# Patient Record
Sex: Female | Born: 1985 | Race: White | Hispanic: No | Marital: Married | Smoking: Never smoker
Health system: Southern US, Community
[De-identification: ages and names within clinical notes are randomized; demographics above are authoritative.]

## PROBLEM LIST (undated history)

## (undated) DIAGNOSIS — E119 Type 2 diabetes mellitus without complications: Secondary | ICD-10-CM

## (undated) DIAGNOSIS — O24419 Gestational diabetes mellitus in pregnancy, unspecified control: Secondary | ICD-10-CM

## (undated) HISTORY — PX: NO PAST SURGERIES: SHX2092

## (undated) HISTORY — PX: DILATION AND CURETTAGE OF UTERUS: SHX78

---
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
## 2004-02-29 ENCOUNTER — Other Ambulatory Visit: Admission: RE | Admit: 2004-02-29 | Discharge: 2004-02-29 | Payer: Self-pay | Admitting: Obstetrics and Gynecology

## 2004-07-28 ENCOUNTER — Other Ambulatory Visit: Admission: RE | Admit: 2004-07-28 | Discharge: 2004-07-28 | Payer: Self-pay | Admitting: Obstetrics and Gynecology

## 2005-12-25 ENCOUNTER — Other Ambulatory Visit: Admission: RE | Admit: 2005-12-25 | Discharge: 2005-12-25 | Payer: Self-pay | Admitting: Obstetrics & Gynecology

## 2006-03-01 ENCOUNTER — Other Ambulatory Visit: Admission: RE | Admit: 2006-03-01 | Discharge: 2006-03-01 | Payer: Self-pay | Admitting: Obstetrics & Gynecology

## 2015-08-28 ENCOUNTER — Encounter: Payer: Self-pay | Admitting: Nurse Practitioner

## 2016-02-20 ENCOUNTER — Other Ambulatory Visit (HOSPITAL_COMMUNITY): Payer: Self-pay | Admitting: Obstetrics and Gynecology

## 2016-02-20 DIAGNOSIS — Z3141 Encounter for fertility testing: Secondary | ICD-10-CM

## 2016-02-25 ENCOUNTER — Encounter (HOSPITAL_COMMUNITY): Payer: Self-pay | Admitting: Radiology

## 2016-02-25 ENCOUNTER — Ambulatory Visit (HOSPITAL_COMMUNITY)
Admission: RE | Admit: 2016-02-25 | Discharge: 2016-02-25 | Disposition: A | Discharge status: Home / Self Care | Payer: 59 | Source: Ambulatory Visit | Attending: Obstetrics and Gynecology | Admitting: Obstetrics and Gynecology

## 2016-02-25 DIAGNOSIS — Z3141 Encounter for fertility testing: Secondary | ICD-10-CM | POA: Insufficient documentation

## 2016-02-25 MED ORDER — IOPAMIDOL (ISOVUE-300) INJECTION 61%
30.0000 mL | Freq: Once | INTRAVENOUS | Status: AC | PRN
  Administered 2016-02-25: 7 mL

## 2017-02-10 DIAGNOSIS — Z3401 Encounter for supervision of normal first pregnancy, first trimester: Secondary | ICD-10-CM | POA: Diagnosis not present

## 2017-02-10 DIAGNOSIS — Z3685 Encounter for antenatal screening for Streptococcus B: Secondary | ICD-10-CM | POA: Diagnosis not present

## 2017-02-10 LAB — OB RESULTS CONSOLE ABO/RH: RH Type: POSITIVE

## 2017-02-10 LAB — OB RESULTS CONSOLE GC/CHLAMYDIA
CHLAMYDIA, DNA PROBE: NEGATIVE
Gonorrhea: NEGATIVE

## 2017-02-10 LAB — OB RESULTS CONSOLE RUBELLA ANTIBODY, IGM: Rubella: IMMUNE

## 2017-02-10 LAB — OB RESULTS CONSOLE HEPATITIS B SURFACE ANTIGEN: HEP B S AG: NEGATIVE

## 2017-02-10 LAB — OB RESULTS CONSOLE HIV ANTIBODY (ROUTINE TESTING): HIV: NONREACTIVE

## 2017-02-10 LAB — OB RESULTS CONSOLE ANTIBODY SCREEN: Antibody Screen: NEGATIVE

## 2017-02-10 LAB — OB RESULTS CONSOLE RPR: RPR: NONREACTIVE

## 2017-02-12 DIAGNOSIS — Z3400 Encounter for supervision of normal first pregnancy, unspecified trimester: Secondary | ICD-10-CM | POA: Diagnosis not present

## 2017-02-12 DIAGNOSIS — Z3A11 11 weeks gestation of pregnancy: Secondary | ICD-10-CM | POA: Diagnosis not present

## 2017-02-12 DIAGNOSIS — Z3480 Encounter for supervision of other normal pregnancy, unspecified trimester: Secondary | ICD-10-CM | POA: Diagnosis not present

## 2017-02-17 DIAGNOSIS — Z3A12 12 weeks gestation of pregnancy: Secondary | ICD-10-CM | POA: Diagnosis not present

## 2017-02-17 DIAGNOSIS — Z3491 Encounter for supervision of normal pregnancy, unspecified, first trimester: Secondary | ICD-10-CM | POA: Diagnosis not present

## 2017-04-01 DIAGNOSIS — Z3A18 18 weeks gestation of pregnancy: Secondary | ICD-10-CM | POA: Diagnosis not present

## 2017-05-31 DIAGNOSIS — Z3480 Encounter for supervision of other normal pregnancy, unspecified trimester: Secondary | ICD-10-CM | POA: Diagnosis not present

## 2017-05-31 DIAGNOSIS — Z23 Encounter for immunization: Secondary | ICD-10-CM | POA: Diagnosis not present

## 2017-08-03 DIAGNOSIS — Z3685 Encounter for antenatal screening for Streptococcus B: Secondary | ICD-10-CM | POA: Diagnosis not present

## 2017-08-20 ENCOUNTER — Telehealth (HOSPITAL_COMMUNITY): Payer: Self-pay | Admitting: *Deleted

## 2017-08-20 ENCOUNTER — Encounter (HOSPITAL_COMMUNITY): Payer: Self-pay | Admitting: *Deleted

## 2017-08-20 NOTE — Telephone Encounter (Signed)
Preadmission screen  

## 2017-08-29 ENCOUNTER — Inpatient Hospital Stay (HOSPITAL_COMMUNITY): Admission: AD | Admit: 2017-08-29 | Payer: 59 | Source: Ambulatory Visit | Admitting: Obstetrics and Gynecology

## 2017-08-30 MED ORDER — MISOPROSTOL 25 MCG QUARTER TABLET
25.0000 ug | ORAL_TABLET | ORAL | Status: DC
  Administered 2017-08-31 (×2): 25 ug via VAGINAL
  Filled 2017-08-30 (×7): qty 1

## 2017-08-31 ENCOUNTER — Inpatient Hospital Stay (HOSPITAL_COMMUNITY)
Admission: RE | Admit: 2017-08-31 | Discharge: 2017-09-02 | DRG: 807 | Disposition: A | Discharge status: Home / Self Care | Payer: 59 | Attending: Obstetrics and Gynecology | Admitting: Obstetrics and Gynecology

## 2017-08-31 ENCOUNTER — Other Ambulatory Visit: Payer: Self-pay

## 2017-08-31 ENCOUNTER — Encounter (HOSPITAL_COMMUNITY): Payer: Self-pay

## 2017-08-31 ENCOUNTER — Inpatient Hospital Stay (HOSPITAL_COMMUNITY): Payer: 59 | Admitting: Anesthesiology

## 2017-08-31 DIAGNOSIS — O9902 Anemia complicating childbirth: Principal | ICD-10-CM | POA: Diagnosis present

## 2017-08-31 DIAGNOSIS — D649 Anemia, unspecified: Secondary | ICD-10-CM | POA: Diagnosis not present

## 2017-08-31 DIAGNOSIS — O26893 Other specified pregnancy related conditions, third trimester: Secondary | ICD-10-CM | POA: Diagnosis not present

## 2017-08-31 DIAGNOSIS — Z3A40 40 weeks gestation of pregnancy: Secondary | ICD-10-CM

## 2017-08-31 LAB — CBC
HCT: 34.3 % — ABNORMAL LOW (ref 36.0–46.0)
Hemoglobin: 11.5 g/dL — ABNORMAL LOW (ref 12.0–15.0)
MCH: 26.8 pg (ref 26.0–34.0)
MCHC: 33.5 g/dL (ref 30.0–36.0)
MCV: 80 fL (ref 78.0–100.0)
Platelets: 199 10*3/uL (ref 150–400)
RBC: 4.29 MIL/uL (ref 3.87–5.11)
RDW: 14.2 % (ref 11.5–15.5)
WBC: 9.4 10*3/uL (ref 4.0–10.5)

## 2017-08-31 LAB — ABO/RH: ABO/RH(D): A POS

## 2017-08-31 LAB — OB RESULTS CONSOLE GBS: GBS: NEGATIVE

## 2017-08-31 LAB — TYPE AND SCREEN
ABO/RH(D): A POS
ANTIBODY SCREEN: NEGATIVE

## 2017-08-31 LAB — RPR: RPR Ser Ql: NONREACTIVE

## 2017-08-31 MED ORDER — EPHEDRINE 5 MG/ML INJ
10.0000 mg | INTRAVENOUS | Status: DC | PRN
  Filled 2017-08-31: qty 2

## 2017-08-31 MED ORDER — PHENYLEPHRINE 40 MCG/ML (10ML) SYRINGE FOR IV PUSH (FOR BLOOD PRESSURE SUPPORT)
80.0000 ug | PREFILLED_SYRINGE | INTRAVENOUS | Status: DC | PRN
  Filled 2017-08-31: qty 5
  Filled 2017-08-31: qty 10

## 2017-08-31 MED ORDER — OXYTOCIN 40 UNITS IN LACTATED RINGERS INFUSION - SIMPLE MED
1.0000 m[IU]/min | INTRAVENOUS | Status: DC
  Administered 2017-08-31: 2 m[IU]/min via INTRAVENOUS

## 2017-08-31 MED ORDER — TERBUTALINE SULFATE 1 MG/ML IJ SOLN
0.2500 mg | Freq: Once | INTRAMUSCULAR | Status: DC | PRN
  Filled 2017-08-31: qty 1

## 2017-08-31 MED ORDER — PRENATAL MULTIVITAMIN CH
1.0000 | ORAL_TABLET | Freq: Every day | ORAL | Status: DC
  Filled 2017-08-31: qty 1

## 2017-08-31 MED ORDER — OXYCODONE-ACETAMINOPHEN 5-325 MG PO TABS: 1.0000 | ORAL_TABLET | ORAL | Status: DC | PRN

## 2017-08-31 MED ORDER — MEDROXYPROGESTERONE ACETATE 150 MG/ML IM SUSP: 150.0000 mg | INTRAMUSCULAR | Status: DC | PRN

## 2017-08-31 MED ORDER — ONDANSETRON HCL 4 MG/2ML IJ SOLN: 4.0000 mg | INTRAMUSCULAR | Status: DC | PRN

## 2017-08-31 MED ORDER — FENTANYL 2.5 MCG/ML BUPIVACAINE 1/10 % EPIDURAL INFUSION (WH - ANES)
14.0000 mL/h | INTRAMUSCULAR | Status: DC | PRN
  Administered 2017-08-31 (×2): 14 mL/h via EPIDURAL
  Filled 2017-08-31 (×2): qty 100

## 2017-08-31 MED ORDER — SOD CITRATE-CITRIC ACID 500-334 MG/5ML PO SOLN: 30.0000 mL | ORAL | Status: DC | PRN

## 2017-08-31 MED ORDER — OXYTOCIN BOLUS FROM INFUSION
500.0000 mL | Freq: Once | INTRAVENOUS | Status: AC
  Administered 2017-08-31: 500 mL via INTRAVENOUS

## 2017-08-31 MED ORDER — LIDOCAINE HCL (PF) 1 % IJ SOLN
30.0000 mL | INTRAMUSCULAR | Status: DC | PRN
  Filled 2017-08-31: qty 30

## 2017-08-31 MED ORDER — DIPHENHYDRAMINE HCL 25 MG PO CAPS: 25.0000 mg | ORAL_CAPSULE | Freq: Four times a day (QID) | ORAL | Status: DC | PRN

## 2017-08-31 MED ORDER — EPHEDRINE 5 MG/ML INJ: 10.0000 mg | INTRAVENOUS | Status: DC | PRN

## 2017-08-31 MED ORDER — PHENYLEPHRINE 40 MCG/ML (10ML) SYRINGE FOR IV PUSH (FOR BLOOD PRESSURE SUPPORT)
80.0000 ug | PREFILLED_SYRINGE | INTRAVENOUS | Status: DC | PRN
  Filled 2017-08-31: qty 5

## 2017-08-31 MED ORDER — MEASLES, MUMPS & RUBELLA VAC ~~LOC~~ INJ: 0.5000 mL | INJECTION | Freq: Once | SUBCUTANEOUS | Status: DC

## 2017-08-31 MED ORDER — DIPHENHYDRAMINE HCL 50 MG/ML IJ SOLN: 12.5000 mg | INTRAMUSCULAR | Status: DC | PRN

## 2017-08-31 MED ORDER — ACETAMINOPHEN 325 MG PO TABS: 650.0000 mg | ORAL_TABLET | ORAL | Status: DC | PRN

## 2017-08-31 MED ORDER — ONDANSETRON HCL 4 MG/2ML IJ SOLN: 4.0000 mg | Freq: Four times a day (QID) | INTRAMUSCULAR | Status: DC | PRN

## 2017-08-31 MED ORDER — IBUPROFEN 600 MG PO TABS
600.0000 mg | ORAL_TABLET | Freq: Four times a day (QID) | ORAL | Status: DC
  Administered 2017-09-01 – 2017-09-02 (×6): 600 mg via ORAL
  Filled 2017-08-31 (×6): qty 1

## 2017-08-31 MED ORDER — LACTATED RINGERS IV SOLN
500.0000 mL | Freq: Once | INTRAVENOUS | Status: AC
  Administered 2017-08-31: 500 mL via INTRAVENOUS

## 2017-08-31 MED ORDER — LACTATED RINGERS IV SOLN
INTRAVENOUS | Status: DC
  Administered 2017-08-31 (×4): via INTRAVENOUS

## 2017-08-31 MED ORDER — OXYCODONE-ACETAMINOPHEN 5-325 MG PO TABS: 2.0000 | ORAL_TABLET | ORAL | Status: DC | PRN

## 2017-08-31 MED ORDER — COCONUT OIL OIL
1.0000 "application " | TOPICAL_OIL | Status: DC | PRN
  Administered 2017-09-02: 1 via TOPICAL
  Filled 2017-08-31: qty 120

## 2017-08-31 MED ORDER — ACETAMINOPHEN 325 MG PO TABS
650.0000 mg | ORAL_TABLET | ORAL | Status: DC | PRN
Start: 2017-08-31 — End: 2017-09-02
  Administered 2017-09-01: 650 mg via ORAL
  Filled 2017-08-31: qty 2

## 2017-08-31 MED ORDER — SENNOSIDES-DOCUSATE SODIUM 8.6-50 MG PO TABS
2.0000 | ORAL_TABLET | ORAL | Status: DC
  Administered 2017-09-01: 2 via ORAL
  Filled 2017-08-31: qty 2

## 2017-08-31 MED ORDER — OXYTOCIN 40 UNITS IN LACTATED RINGERS INFUSION - SIMPLE MED
2.5000 [IU]/h | INTRAVENOUS | Status: DC
  Filled 2017-08-31: qty 1000

## 2017-08-31 MED ORDER — METHYLERGONOVINE MALEATE 0.2 MG/ML IJ SOLN
INTRAMUSCULAR | Status: AC
  Filled 2017-08-31: qty 1

## 2017-08-31 MED ORDER — DIBUCAINE 1 % RE OINT: 1.0000 "application " | TOPICAL_OINTMENT | RECTAL | Status: DC | PRN

## 2017-08-31 MED ORDER — ONDANSETRON HCL 4 MG PO TABS: 4.0000 mg | ORAL_TABLET | ORAL | Status: DC | PRN

## 2017-08-31 MED ORDER — LACTATED RINGERS IV SOLN: 500.0000 mL | Freq: Once | INTRAVENOUS | Status: DC

## 2017-08-31 MED ORDER — TETANUS-DIPHTH-ACELL PERTUSSIS 5-2.5-18.5 LF-MCG/0.5 IM SUSP: 0.5000 mL | Freq: Once | INTRAMUSCULAR | Status: DC

## 2017-08-31 MED ORDER — SIMETHICONE 80 MG PO CHEW: 80.0000 mg | CHEWABLE_TABLET | ORAL | Status: DC | PRN

## 2017-08-31 MED ORDER — PHENYLEPHRINE 40 MCG/ML (10ML) SYRINGE FOR IV PUSH (FOR BLOOD PRESSURE SUPPORT): 80.0000 ug | PREFILLED_SYRINGE | INTRAVENOUS | Status: DC | PRN

## 2017-08-31 MED ORDER — LACTATED RINGERS IV SOLN: 500.0000 mL | INTRAVENOUS | Status: DC | PRN

## 2017-08-31 MED ORDER — WITCH HAZEL-GLYCERIN EX PADS: 1.0000 "application " | MEDICATED_PAD | CUTANEOUS | Status: DC | PRN

## 2017-08-31 MED ORDER — LIDOCAINE HCL (PF) 1 % IJ SOLN
INTRAMUSCULAR | Status: DC | PRN
  Administered 2017-08-31: 5 mL via EPIDURAL

## 2017-08-31 MED ORDER — BUTORPHANOL TARTRATE 1 MG/ML IJ SOLN
1.0000 mg | INTRAMUSCULAR | Status: DC | PRN
  Administered 2017-08-31 (×2): 1 mg via INTRAVENOUS
  Filled 2017-08-31 (×2): qty 1

## 2017-08-31 MED ORDER — BENZOCAINE-MENTHOL 20-0.5 % EX AERO
1.0000 "application " | INHALATION_SPRAY | CUTANEOUS | Status: DC | PRN
  Administered 2017-09-02: 1 via TOPICAL
  Filled 2017-08-31 (×2): qty 56

## 2017-08-31 NOTE — Anesthesia Preprocedure Evaluation (Signed)
Anesthesia Evaluation  Patient identified by MRN, date of birth, ID band Patient awake    Reviewed: Allergy & Precautions, NPO status , Patient's Chart, lab work & pertinent test results  Airway Mallampati: II  TM Distance: >3 FB Neck ROM: Full    Dental no notable dental hx. (+) Teeth Intact   Pulmonary neg pulmonary ROS,    Pulmonary exam normal breath sounds clear to auscultation       Cardiovascular Exercise Tolerance: Good negative cardio ROS Normal cardiovascular exam Rhythm:Regular Rate:Normal     Neuro/Psych negative neurological ROS  negative psych ROS   GI/Hepatic negative GI ROS,   Endo/Other  negative endocrine ROS  Renal/GU negative Renal ROS     Musculoskeletal   Abdominal   Peds  Hematology  (+) anemia ,   Anesthesia Other Findings   Reproductive/Obstetrics (+) Pregnancy                             Lab Results  Component Value Date   WBC 9.4 08/31/2017   HGB 11.5 (L) 08/31/2017   HCT 34.3 (L) 08/31/2017   MCV 80.0 08/31/2017   PLT 199 08/31/2017    Anesthesia Physical Anesthesia Plan  ASA: II  Anesthesia Plan: Epidural   Post-op Pain Management:    Induction:   PONV Risk Score and Plan:   Airway Management Planned:   Additional Equipment:   Intra-op Plan:   Post-operative Plan:   Informed Consent: I have reviewed the patients History and Physical, chart, labs and discussed the procedure including the risks, benefits and alternatives for the proposed anesthesia with the patient or authorized representative who has indicated his/her understanding and acceptance.     Plan Discussed with:   Anesthesia Plan Comments:         Anesthesia Quick Evaluation

## 2017-08-31 NOTE — Anesthesia Pain Management Evaluation Note (Signed)
  [facility] Pain Management Visit Note  Patient: [name], 32 y.o., female  "Hello I am a member of the anesthesia team at [facility]. We have an anesthesia team available at all times to provide care throughout the hospital, including epidural management and anesthesia for C-section. I don't know your plan for the delivery whether it a natural birth, water birth, IV sedation, nitrous supplementation, doula or epidural, but we want to meet your pain goals."   1.Was your pain managed to your expectations on prior hospitalizations?   No prior hospitalizations  2.What is your expectation for pain management during this hospitalization?     Epidural  3.How can we help you reach that goal?   Record the patient's initial score and the patient's pain goal.   Pain: 6  Pain Goal: 4 [facility] wants you to be able to say your pain was always managed very well.  [name],[name] 08/31/2017

## 2017-08-31 NOTE — Progress Notes (Signed)
Pt comfortable w/ epidural  FHT cat 1 Toco Q2 Cvx c/c/+1  A/P:  Will start pushing

## 2017-08-31 NOTE — Anesthesia Procedure Notes (Signed)
Epidural Patient location during procedure: OB Start time: 08/31/2017 8:08 AM End time: 08/31/2017 8:19 AM  Staffing Anesthesiologist: Trevor IhaHouser, Arletha Marschke A, MD Performed: anesthesiologist   Preanesthetic Checklist Completed: patient identified, site marked, surgical consent, pre-op evaluation, timeout performed, IV checked, risks and benefits discussed and monitors and equipment checked  Epidural Patient position: sitting Prep: site prepped and draped and DuraPrep Patient monitoring: continuous pulse ox and blood pressure Approach: midline Location: L3-L4 Injection technique: LOR air  Needle:  Needle type: Tuohy  Needle gauge: 17 G Needle length: 9 cm and 9 Needle insertion depth: 5 cm cm Catheter type: closed end flexible Catheter size: 19 Gauge Catheter at skin depth: 11 cm Test dose: negative  Assessment Events: blood not aspirated, injection not painful, no injection resistance, negative IV test and no paresthesia  Additional Notes One attempt. Pt tolerated procedure well.

## 2017-08-31 NOTE — Progress Notes (Signed)
SVD of vigorous female infant w/ apgars of 9,9.  Placenta delivered spontaneous w/ 3VC.   2nd degree lac repaired w/ 3-0 vicryl rapide.  Fundus firm.  EBL 250cc .

## 2017-08-31 NOTE — H&P (Signed)
Brenda GeraldSarah [name] is a 32 y.o. female presenting for IOL.  IVF pregnancy - uncomplicated.  S/p cytotec x 2 overnight  OB History    Gravida  1   Para      Term      Preterm      AB      Living        SAB      TAB      Ectopic      Multiple      Live Births             Past Medical History:  Diagnosis Date  . Newborn product of in vitro fertilization (IVF) pregnancy    Past Surgical History:  Procedure Laterality Date  . NO PAST SURGERIES     Family History: family history includes Breast cancer in her paternal grandmother; Diabetes in her paternal grandmother; Heart disease in her paternal grandfather; Hypertension in her father; Thyroid disease in her mother. Social History:  reports that she has never smoked. She has never used smokeless tobacco. She reports that she drank alcohol. She reports that she does not use drugs.     Maternal Diabetes: No Genetic Screening: Declined Maternal Ultrasounds/Referrals: Normal Fetal Ultrasounds or other Referrals:  None Maternal Substance Abuse:  No Significant Maternal Medications:  None Significant Maternal Lab Results:  None Other Comments:  None  ROS History Dilation: 1 Effacement (%): 50 Station: -2 Exam by:: A. Wallace CullensGray RN  Blood pressure 117/82, pulse 62, temperature 98.3 F (36.8 C), temperature source Oral, resp. rate 18, height 5\' 7"  (1.702 m), weight 165 lb 4.8 oz (75 kg), last menstrual period 12/10/2016, SpO2 100 %. Exam Physical Exam  Gen - comfortable w/ epidural Abd - gravid, NT Ext - NT, no edema cvx 2/80/-2 AROM - clear IUPC placed Prenatal labs: ABO, Rh: --/--/A POS, A POS Performed at [facility], [address] [place], [place] [address]  [phone](08/06 0110) Antibody: NEG (08/06 0110) Rubella: Immune (01/16 0000) RPR: Nonreactive (01/16 0000)  HBsAg: Negative (01/16 0000)  HIV: Non-reactive (01/16 0000)  GBS: Negative (08/06 0000)   Assessment/Plan: Start pitocin  Brenda CairoGretchen  Odysseus [name] 08/31/2017, 9:15 [name]

## 2017-09-01 LAB — CBC
HCT: 29.4 % — ABNORMAL LOW (ref 36.0–46.0)
HEMOGLOBIN: 9.9 g/dL — AB (ref 12.0–15.0)
MCH: 27.2 pg (ref 26.0–34.0)
MCHC: 33.7 g/dL (ref 30.0–36.0)
MCV: 80.8 fL (ref 78.0–100.0)
Platelets: 165 10*3/uL (ref 150–400)
RBC: 3.64 MIL/uL — ABNORMAL LOW (ref 3.87–5.11)
RDW: 14.2 % (ref 11.5–15.5)
WBC: 14.5 10*3/uL — AB (ref 4.0–10.5)

## 2017-09-01 NOTE — Anesthesia Postprocedure Evaluation (Signed)
Anesthesia Post Note  Patient: Brenda GeraldSarah [name]  Procedure(s) Performed: AN AD HOC LABOR EPIDURAL     Patient location during evaluation: Mother Baby Anesthesia Type: Epidural Level of consciousness: awake and alert Pain management: pain level controlled Vital Signs Assessment: post-procedure vital signs reviewed and stable Respiratory status: spontaneous breathing, nonlabored ventilation and respiratory function stable Cardiovascular status: stable Postop Assessment: no headache, no backache, epidural receding, no apparent nausea or vomiting, able to ambulate, patient able to bend at knees and adequate PO intake Anesthetic complications: no    Last Vitals:  Vitals:   09/01/17 0049 09/01/17 0551  BP: 102/63 103/66  Pulse: 60 65  Resp: 17 17  Temp: 36.8 C 36.7 C  SpO2:  97%    Last Pain:  Vitals:   09/01/17 0551  TempSrc: Oral  PainSc:    Pain Goal: Patients Stated Pain Goal: 3 (08/31/17 0240)               Laban EmperorMalinova,Marvyn Torrez Hristova

## 2017-09-01 NOTE — Progress Notes (Signed)
Post Partum Day 1 Subjective: no complaints, up ad lib, voiding and tolerating PO  Objective: Blood pressure 115/85, pulse 74, temperature 98 F (36.7 C), temperature source Oral, resp. rate 17, height 5\' 7"  (1.702 m), weight 75 kg (165 lb 4.8 oz), last menstrual period 12/10/2016, SpO2 98 %, unknown if currently breastfeeding.  Physical Exam:  General: alert, cooperative and appears stated age Lochia: appropriate Uterine Fundus: firm Incision: healing well, no significant drainage, no dehiscence, no significant erythema DVT Evaluation: No evidence of DVT seen on physical exam. Negative Homan's sign. No cords or calf tenderness. No significant calf/ankle edema.  Recent Labs    08/31/17 0110 09/01/17 0539  HGB 11.5* 9.9*  HCT 34.3* 29.4*    Assessment/Plan: Plan for discharge tomorrow, Breastfeeding and Circumcision prior to discharge  D/W patient female infant circumcision, risks/benefits reviewed. All questions answered.    LOS: 1 day   Ranae Pilalise Jennifer Celia Friedland 09/01/2017, 10:46 AM

## 2017-09-01 NOTE — Progress Notes (Signed)
MOB was referred for history of depression/anxiety. * Referral screened out by Clinical Social Worker because none of the following criteria appear to apply: ~ History of anxiety/depression during this pregnancy, or of post-partum depression following prior delivery. ~ Diagnosis of anxiety and/or depression within last 3 years OR * MOB's symptoms currently being treated with medication and/or therapy. Please contact the Clinical Social Worker if needs arise, by MOB request, or if MOB scores greater than 9/yes to question 10 on Edinburgh Postpartum Depression Screen.  Francie Keeling Boyd-Gilyard, MSW, LCSW Clinical Social Work [phone]  

## 2017-09-01 NOTE — Plan of Care (Signed)
Teaching, safety and baby safety completed on admission. Progressing well through shift.

## 2017-09-01 NOTE — Lactation Note (Signed)
This note was copied from a baby's chart. Lactation Consultation Note Baby 12 hrs old. Shows cues but will not latch. Mom has flat nipples. Shells helpful. Mom has hand pump. Encouraged to pre-pump before latching, also pump and hand express if baby not feeding.  RN fitted mom #20 NS. Fit well. Taught application.  LC hand expressed 3 ml colostrum. Mom demonstrated hand expression. Spoon fed baby well. Newborn behavior, feeding habits, STS, I&O, cluster feeding, supply and demand discussed. Mom encouraged to feed baby 8-12 times/24 hours and with feeding cues. Mom encouraged to waken baby for feeds if baby hasn't cued in 3 hrs.  WH/LC brochure given w/resources, support groups and LC services.  Patient Name: Brenda Daryel GeraldSarah Stamas ONGEX'BToday's Date: 09/01/2017 Reason for consult: Initial assessment;1st time breastfeeding   Maternal Data Has patient been taught Hand Expression?: Yes Does the patient have breastfeeding experience prior to this delivery?: No  Feeding Feeding Type: Breast Milk Length of feed: 0 min  LATCH Score Latch: Too sleepy or reluctant, no latch achieved, no sucking elicited.  Audible Swallowing: None  Type of Nipple: Flat  Comfort (Breast/Nipple): Soft / non-tender  Hold (Positioning): Full assist, staff holds infant at breast  LATCH Score: 3  Interventions Interventions: Breast feeding basics reviewed;Support pillows;Assisted with latch;Position options;Skin to skin;Expressed milk;Breast massage;Hand express;Shells;Pre-pump if needed;Hand pump;Breast compression;Adjust position  Lactation Tools Discussed/Used Tools: Shells;Pump;Nipple Shields Nipple shield size: 20 Shell Type: Inverted Breast pump type: Manual Pump Review: Setup, frequency, and cleaning;Milk Storage Initiated by:: RN Date initiated:: 09/01/17   Consult Status Consult Status: Follow-up Date: 09/01/17 Follow-up type: In-patient    Charyl DancerCARVER, Corry Storie G 09/01/2017, 5:51 AM

## 2017-09-02 LAB — BIRTH TISSUE RECOVERY COLLECTION (PLACENTA DONATION)

## 2017-09-02 MED ORDER — IBUPROFEN 600 MG PO TABS: 600.0000 mg | ORAL_TABLET | Freq: Four times a day (QID) | ORAL | 0 refills | Status: DC | PRN

## 2017-09-02 NOTE — Lactation Note (Signed)
This note was copied from a baby's chart. Lactation Consultation Note  Patient Name: Boy [name] JXBJY'NToday's Date: 09/02/2017 Reason for consult: Follow-up assessment;Term P1, 32 hour female infant Per,  parents infant been sleeping a lot had circumcision earlier today. Encourage mom to undress infant and do STS, Infant starting cuing, became fussy had wet diaper and mucous emesis. Dad burped baby. Mom attempted to  latch baby in cross -cradle position and infant holding breast in mouth with wide gape but not sucking, Mom check diaper and it was soiled but infant not finished. Mom plans to latch infant to breast when infant is finished soiling diaper. LC unable to observe latch at this time. Mom has hand expressed 3ml of colostrum which she plans to give to infant after latching him to breast. Per mom, infant is latching with out NS now, Mom will pre-pump and do nipple roll  for breast stimulation and eversion,before latching infant to breast due to having flat nipples. Mom encouraged to feed baby 8-12 times/24 hours and with feeding cues.  Mom plans to pump q 3hrs for 15-20 minutes.  Mom made aware of O/P services, breastfeeding support groups, community resources, and our phone # for post-discharge questions.  Parents will call LC if they have any more questions or concerns.    Maternal Data Formula Feeding for Exclusion: No Has patient been taught Hand Expression?: Yes Does the patient have breastfeeding experience prior to this delivery?: No  Feeding    LATCH Score                   Interventions    Lactation Tools Discussed/Used     Consult Status Consult Status: Follow-up Date: 09/02/17 Follow-up type: In-patient    [name] 09/02/2017, 2:36 AM

## 2017-09-02 NOTE — Discharge Summary (Signed)
Obstetric Discharge Summary Reason for Admission: induction of labor Prenatal Procedures: none Intrapartum Procedures: spontaneous vaginal delivery Postpartum Procedures: none Complications-Operative and Postpartum: none Hemoglobin  Date Value Ref Range Status  09/01/2017 9.9 (L) 12.0 - 15.0 g/dL Final   HCT  Date Value Ref Range Status  09/01/2017 29.4 (L) 36.0 - 46.0 % Final    Physical Exam:  General: alert, cooperative and no distress Lochia: appropriate Uterine Fundus: firm Incision: healing well DVT Evaluation: No evidence of DVT seen on physical exam.  Discharge Diagnoses: Term Pregnancy-delivered  Discharge Information: Date: 09/02/2017 Activity: pelvic rest Diet: routine Medications: PNV and Ibuprofen Condition: stable Instructions: refer to practice specific booklet Discharge to: home   Newborn Data: Live born female  Birth Weight: 8 lb 3 oz (3715 g) APGAR: 9, 9  Newborn Delivery   Birth date/time:  08/31/2017 17:40:00 Delivery type:  Vaginal, Spontaneous     Home with mother.  Roselle LocusJames E Karilyn Wind II 09/02/2017, 8:43 AM

## 2017-09-02 NOTE — Lactation Note (Addendum)
This note was copied from a baby's chart. Lactation Consultation Note  Patient Name: Brenda Daryel GeraldSarah Alberts ZOXWR'UToday's Date: 09/02/2017 Reason for consult: Follow-up assessment;Nipple pain/trauma;Term   Follow up with mom of 3239 hour old infant. Infant with 6 BF for 30-45 minutes plus cluster feeding last night, EBM x 3 via syringe of 3-10 cc, 2 voids and 4 stools in the last 24 hours. Infant weight 7 pounds 11 ounces with 5% weight loss since birth. LATCH scores 7-8. Infant asleep in crib.   Mom reports infant cluster fed for 4 hours last night. We attempted to awaken him to feed and he was not interested to feed despite stimulation and STS. Enc mom to feed 8-12 x in 24 hours with feeding cues. Enc mom to offer any EBM that she is able to obtain as she has been.   Reviewed I/O, signs of dehydration in the infant, signs infant is getting enough, Engorgement prevention/treatmtent, pre pumping and comfort pumping, normalcy of cluster feeding, and breast milk expression and storage. Reviewed milk coming to volume. Reviewed with mom that since she is an IVF patient it may be helpful for her to pump 2-3 x a day post BF to promote milk supply until we are sure milk is in and infant is gaining well. Mom voiced understanding. Mom has Spectra 2 pump at home. Mom able to hand express colostrum well.   Mom reports she feels fuller today. Mom report she is having some nipple tenderness. She is relatching infant as needed when nipples tenderness increases with feeding. Enc EBM prior to Coconut oil to nipples. Nipples are intact. Mom wearing shells between feeds, enc mom not to wear at night.   Reviewed LC Brochure, mom aware of BF Support Groups, LC phone #, and OP services. Mom to call with any questions/concerns as needed. Infant to follow up with Ped tomorrow morning. Mom reports all questions/concerns have been answered at this time.     Maternal Data Formula Feeding for Exclusion: No Has patient been taught Hand  Expression?: Yes Does the patient have breastfeeding experience prior to this delivery?: No  Feeding Feeding Type: Breast Fed Length of feed: (mother states baby fed from 0230-0600)  LATCH Score Latch: Too sleepy or reluctant, no latch achieved, no sucking elicited.  Audible Swallowing: None  Type of Nipple: Everted at rest and after stimulation  Comfort (Breast/Nipple): Filling, red/small blisters or bruises, mild/mod discomfort  Hold (Positioning): No assistance needed to correctly position infant at breast.  LATCH Score: 5  Interventions Interventions: Breast feeding basics reviewed;Support pillows;Assisted with latch;Position options;Skin to skin;Breast compression;Pre-pump if needed;Hand express;Breast massage;Coconut oil;Expressed milk;DEBP  Lactation Tools Discussed/Used WIC Program: No Pump Review: Setup, frequency, and cleaning;Milk Storage Initiated by:: Reviwewed and encouraged 2-3 x a day post BF due to being IVF patient   Consult Status Consult Status: Complete Follow-up type: Call as needed    Ed BlalockSharon S Trestin Vences 09/02/2017, 9:43 AM

## 2017-11-07 DIAGNOSIS — Z23 Encounter for immunization: Secondary | ICD-10-CM | POA: Diagnosis not present

## 2017-12-15 DIAGNOSIS — S0501XA Injury of conjunctiva and corneal abrasion without foreign body, right eye, initial encounter: Secondary | ICD-10-CM | POA: Diagnosis not present

## 2017-12-18 DIAGNOSIS — S0501XD Injury of conjunctiva and corneal abrasion without foreign body, right eye, subsequent encounter: Secondary | ICD-10-CM | POA: Diagnosis not present

## 2017-12-30 IMAGING — RF DG HYSTEROGRAM
4 series · 4 of 4 positions shown · non-contrast
Comparison: None.

CLINICAL DATA: Encounter for fertility testing

EXAM:
HYSTEROSALPINGOGRAM
TECHNIQUE: Hysterosalpingogram was performed by the ordering physician under
fluoroscopy. Fluoroscopic images were submitted for radiologic
interpretation following the procedure. Please see the procedural
report for the amount of contrast and the fluoroscopy time utilized.

[Series 1: run · 1 of 1 slices shown (1 of 4)]
[im 1/1]
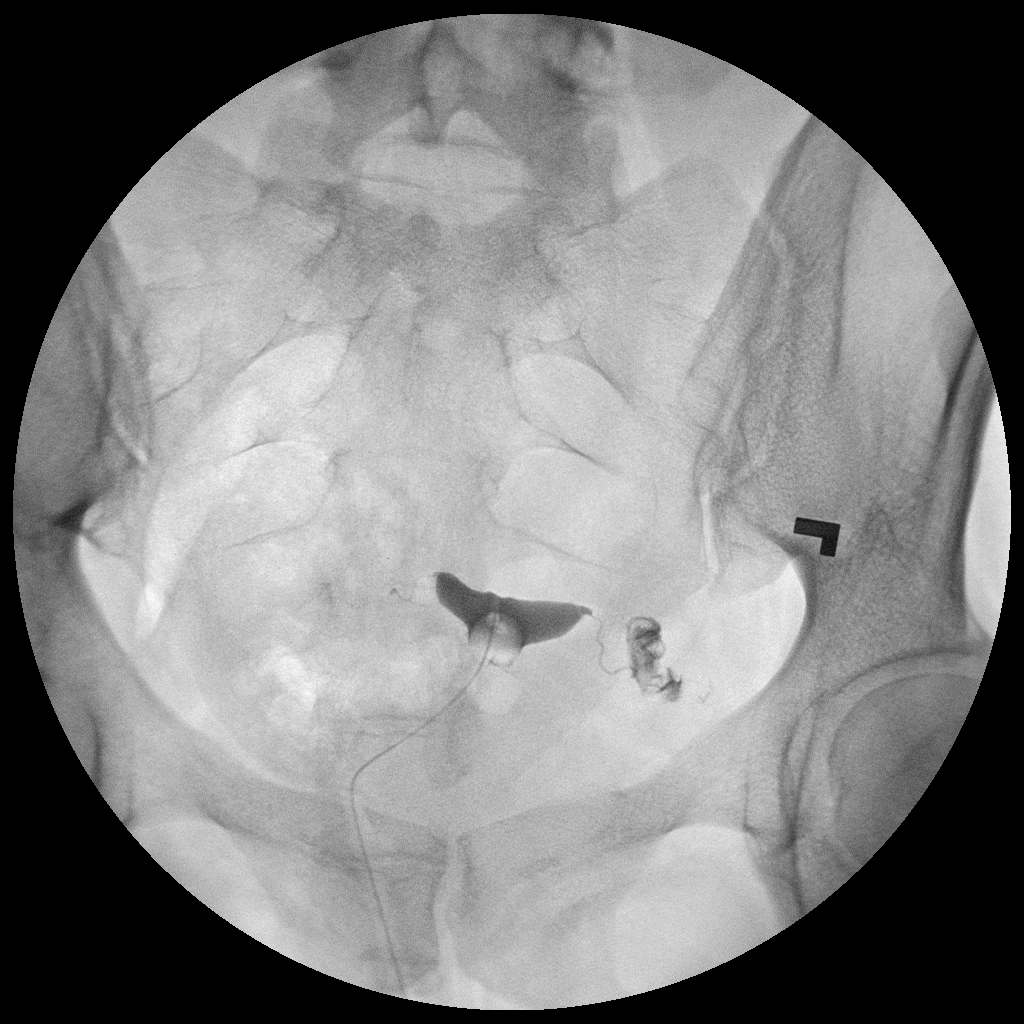

[Series 2: run · 1 of 1 slices shown (2 of 4)]
[im 1/1]
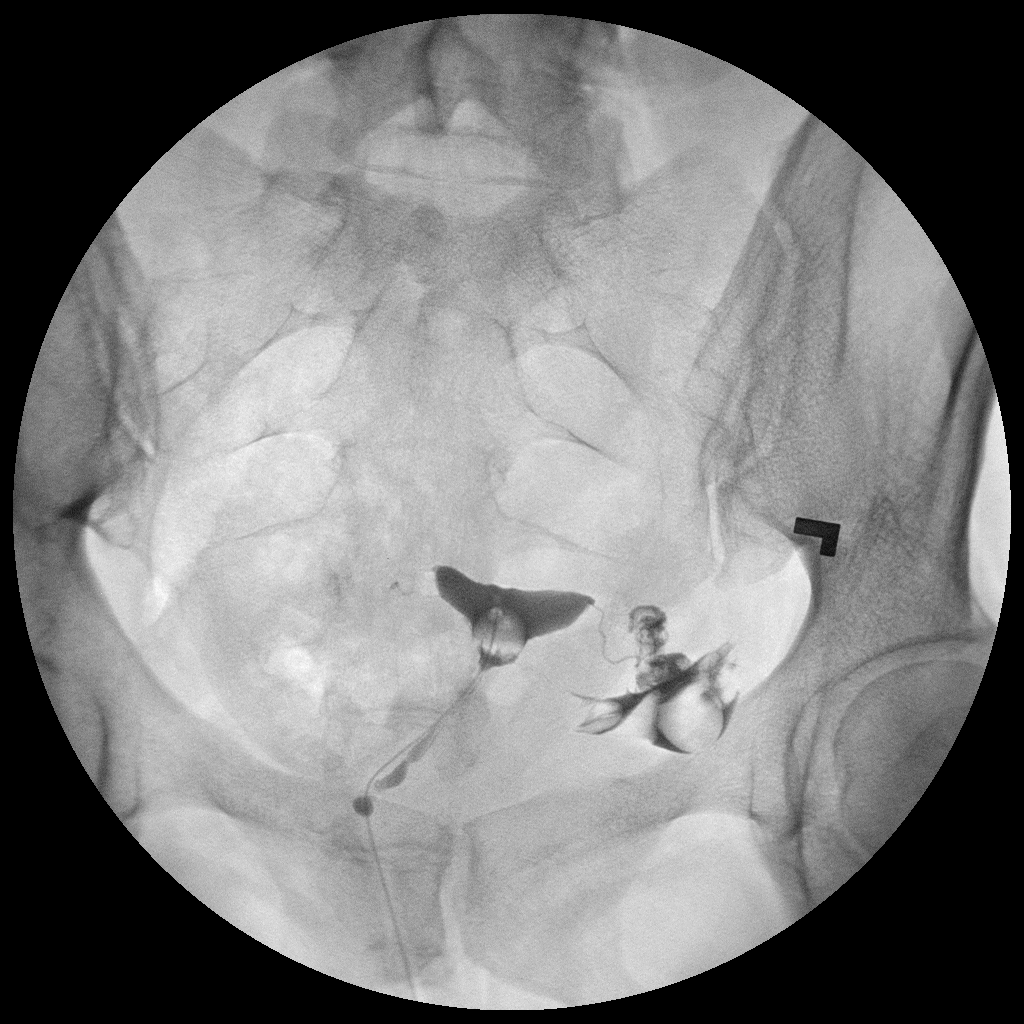

[Series 3: run · 1 of 1 slices shown (3 of 4)]
[im 1/1]
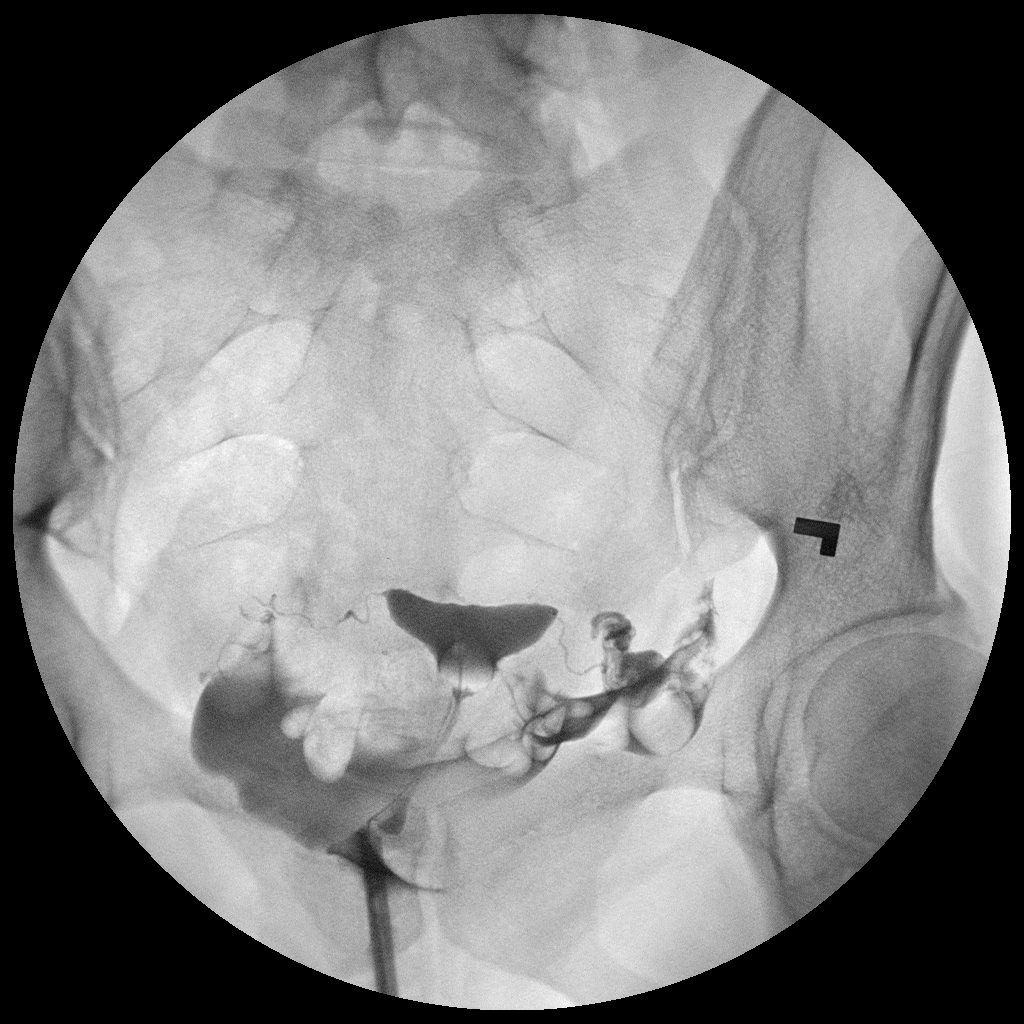

[Series 4: run · 1 of 1 slices shown (4 of 4)]
[im 1/1]
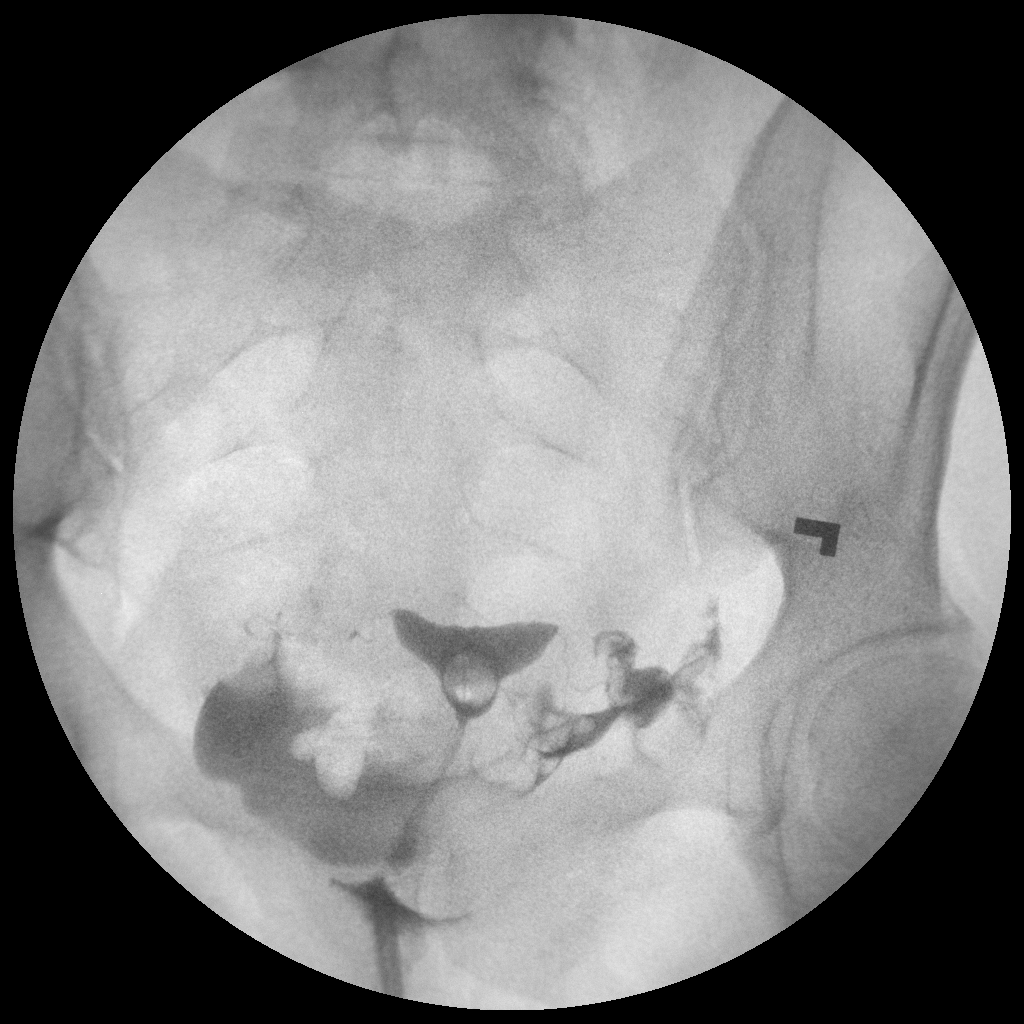

[4 of 4 positions shown; findings below may reference images not displayed]

FINDINGS: Endometrial cavity is normal. Both fallopian tubes fill and have a
normal appearance. Normal spillage bilaterally.
IMPRESSION: Normal study.

## 2018-02-12 DIAGNOSIS — J0100 Acute maxillary sinusitis, unspecified: Secondary | ICD-10-CM | POA: Diagnosis not present

## 2018-04-12 DIAGNOSIS — J45909 Unspecified asthma, uncomplicated: Secondary | ICD-10-CM | POA: Insufficient documentation

## 2018-04-12 DIAGNOSIS — Z9189 Other specified personal risk factors, not elsewhere classified: Secondary | ICD-10-CM | POA: Insufficient documentation

## 2018-04-12 DIAGNOSIS — Z6823 Body mass index (BMI) 23.0-23.9, adult: Secondary | ICD-10-CM | POA: Diagnosis not present

## 2018-04-12 DIAGNOSIS — Z801 Family history of malignant neoplasm of trachea, bronchus and lung: Secondary | ICD-10-CM | POA: Diagnosis not present

## 2018-04-12 DIAGNOSIS — Z309 Encounter for contraceptive management, unspecified: Secondary | ICD-10-CM | POA: Diagnosis not present

## 2018-04-12 DIAGNOSIS — Z01419 Encounter for gynecological examination (general) (routine) without abnormal findings: Secondary | ICD-10-CM | POA: Diagnosis not present

## 2018-04-12 DIAGNOSIS — Z800 Family history of malignant neoplasm of digestive organs: Secondary | ICD-10-CM | POA: Insufficient documentation

## 2018-04-12 DIAGNOSIS — Z803 Family history of malignant neoplasm of breast: Secondary | ICD-10-CM | POA: Diagnosis not present

## 2018-05-04 DIAGNOSIS — Z9189 Other specified personal risk factors, not elsewhere classified: Secondary | ICD-10-CM | POA: Diagnosis not present

## 2018-05-04 DIAGNOSIS — Z800 Family history of malignant neoplasm of digestive organs: Secondary | ICD-10-CM | POA: Diagnosis not present

## 2018-05-10 DIAGNOSIS — M25561 Pain in right knee: Secondary | ICD-10-CM | POA: Diagnosis not present

## 2018-11-16 ENCOUNTER — Other Ambulatory Visit: Payer: Self-pay

## 2018-11-16 DIAGNOSIS — Z20822 Contact with and (suspected) exposure to covid-19: Secondary | ICD-10-CM

## 2018-11-17 LAB — NOVEL CORONAVIRUS, NAA: SARS-CoV-2, NAA: DETECTED — AB

## 2019-10-20 ENCOUNTER — Other Ambulatory Visit: Payer: 59

## 2020-01-15 ENCOUNTER — Other Ambulatory Visit: Payer: 59

## 2020-04-08 ENCOUNTER — Other Ambulatory Visit: Payer: 59

## 2020-05-29 ENCOUNTER — Ambulatory Visit: Payer: 59

## 2020-06-05 ENCOUNTER — Other Ambulatory Visit: Payer: Self-pay

## 2020-06-05 ENCOUNTER — Encounter: Payer: 59 | Attending: Obstetrics and Gynecology | Admitting: Registered"

## 2020-06-05 DIAGNOSIS — O24419 Gestational diabetes mellitus in pregnancy, unspecified control: Secondary | ICD-10-CM | POA: Insufficient documentation

## 2020-06-06 ENCOUNTER — Encounter: Payer: Self-pay | Admitting: Registered"

## 2020-06-06 DIAGNOSIS — O24419 Gestational diabetes mellitus in pregnancy, unspecified control: Secondary | ICD-10-CM | POA: Insufficient documentation

## 2020-06-06 NOTE — Progress Notes (Addendum)
Patient was seen on 06/05/2020 for Gestational Diabetes self-management class at the [facility]. The following learning objectives were met by the patient during this course:   States the definition of Gestational Diabetes  States why dietary management is important in controlling blood glucose  Describes the effects each nutrient has on blood glucose levels  Demonstrates ability to create a balanced meal plan  Demonstrates carbohydrate counting   States when to check blood glucose levels  Demonstrates proper blood glucose monitoring techniques  States the effect of stress and exercise on blood glucose levels  States the importance of limiting caffeine and abstaining from alcohol and smoking  Blood glucose monitor given: Patient is using family member's glucometer prior to class. Patient was provided Accu chek guide me in class: Lot # [id number]; exp 05/31/2021; CBG: 99 mg/dL  Patient instructed to monitor glucose levels: FBS: 60 - <95; 1 hour: <140; 2 hour: <120  Patient received handouts:  Nutrition Diabetes and Pregnancy, including carb counting list  Patient will be seen for follow-up as needed.

## 2020-06-13 ENCOUNTER — Other Ambulatory Visit: Payer: Self-pay

## 2020-06-13 ENCOUNTER — Encounter (HOSPITAL_COMMUNITY): Payer: Self-pay

## 2020-06-13 ENCOUNTER — Inpatient Hospital Stay (HOSPITAL_COMMUNITY)
Admission: AD | Admit: 2020-06-13 | Discharge: 2020-06-14 | Disposition: A | Discharge status: Home / Self Care | Payer: 59 | Attending: Obstetrics and Gynecology | Admitting: Obstetrics and Gynecology

## 2020-06-13 DIAGNOSIS — O4703 False labor before 37 completed weeks of gestation, third trimester: Secondary | ICD-10-CM | POA: Diagnosis not present

## 2020-06-13 DIAGNOSIS — O368131 Decreased fetal movements, third trimester, fetus 1: Secondary | ICD-10-CM | POA: Diagnosis not present

## 2020-06-13 DIAGNOSIS — O30043 Twin pregnancy, dichorionic/diamniotic, third trimester: Secondary | ICD-10-CM | POA: Insufficient documentation

## 2020-06-13 DIAGNOSIS — O26893 Other specified pregnancy related conditions, third trimester: Secondary | ICD-10-CM | POA: Diagnosis present

## 2020-06-13 DIAGNOSIS — Z3A31 31 weeks gestation of pregnancy: Secondary | ICD-10-CM | POA: Diagnosis not present

## 2020-06-13 HISTORY — DX: Gestational diabetes mellitus in pregnancy, unspecified control: O24.419

## 2020-06-13 HISTORY — DX: Type 2 diabetes mellitus without complications: E11.9

## 2020-06-13 LAB — URINALYSIS, ROUTINE W REFLEX MICROSCOPIC
Bilirubin Urine: NEGATIVE
Glucose, UA: NEGATIVE mg/dL
Hgb urine dipstick: NEGATIVE
Ketones, ur: 20 mg/dL — AB
Nitrite: NEGATIVE
Protein, ur: NEGATIVE mg/dL
Specific Gravity, Urine: 1.011 (ref 1.005–1.030)
Squamous Epithelial / HPF: >50 — ABNORMAL HIGH (ref 0–5)
pH: 6 (ref 5.0–8.0)

## 2020-06-13 LAB — FETAL FIBRONECTIN: Fetal Fibronectin: NEGATIVE

## 2020-06-13 MED ORDER — NIFEDIPINE 10 MG PO CAPS: 10.0000 mg | ORAL_CAPSULE | ORAL | 0 refills | Status: DC | PRN

## 2020-06-13 MED ORDER — NIFEDIPINE 10 MG PO CAPS
10.0000 mg | ORAL_CAPSULE | ORAL | Status: AC | PRN
  Administered 2020-06-13 (×3): 10 mg via ORAL
  Filled 2020-06-13 (×3): qty 1

## 2020-06-13 MED ORDER — FENTANYL CITRATE (PF) 100 MCG/2ML IJ SOLN
50.0000 ug | Freq: Once | INTRAMUSCULAR | Status: AC
  Administered 2020-06-13: 50 ug via INTRAVENOUS
  Filled 2020-06-13: qty 2

## 2020-06-13 MED ORDER — CYCLOBENZAPRINE HCL 5 MG PO TABS
10.0000 mg | ORAL_TABLET | Freq: Once | ORAL | Status: AC
  Administered 2020-06-13: 10 mg via ORAL
  Filled 2020-06-13: qty 2

## 2020-06-13 MED ORDER — CYCLOBENZAPRINE HCL 5 MG PO TABS: 5.0000 mg | ORAL_TABLET | Freq: Three times a day (TID) | ORAL | 0 refills | Status: DC | PRN

## 2020-06-13 MED ORDER — LACTATED RINGERS IV BOLUS
1000.0000 mL | Freq: Once | INTRAVENOUS | Status: AC
  Administered 2020-06-13: 1000 mL via INTRAVENOUS

## 2020-06-13 NOTE — MAU Note (Signed)
Pt reports bad period like cramping and increased pelvic pressure for the past 1.5hrs. stated she can feel baby b kick but has not felt baby A much.  Denies any vag bleeding or leaking at this time.

## 2020-06-13 NOTE — Discharge Instructions (Signed)
[employer]'s Emergency Medicine: Concepts and Clinical Practice (9th ed., pp. 2296- 2312). Elsevier.">  Braxton Hicks Contractions Contractions of the uterus can occur throughout pregnancy, but they are not always a sign that you are in labor. You may have practice contractions called Braxton Hicks contractions. These false labor contractions are sometimes confused with true labor. What are Braxton Hicks contractions? Braxton Hicks contractions are tightening movements that occur in the muscles of the uterus before labor. Unlike true labor contractions, these contractions do not result in opening (dilation) and thinning of the cervix. Toward the end of pregnancy (32-34 weeks), Braxton Hicks contractions can happen more often and may become stronger. These contractions are sometimes difficult to tell apart from true labor because they can be very uncomfortable. You should not feel embarrassed if you go to the hospital with false labor. Sometimes, the only way to tell if you are in true labor is for your health care provider to look for changes in the cervix. The health care provider will do a physical exam and may monitor your contractions. If you are not in true labor, the exam should show that your cervix is not dilating and your water has not broken. If there are no other health problems associated with your pregnancy, it is completely safe for you to be sent home with false labor. You may continue to have Braxton Hicks contractions until you go into true labor. How to tell the difference between true labor and false labor True labor  Contractions last 30-70 seconds.  Contractions become very regular.  Discomfort is usually felt in the top of the uterus, and it spreads to the lower abdomen and low back.  Contractions do not go away with walking.  Contractions usually become more intense and increase in frequency.  The cervix dilates and gets thinner. False labor  Contractions are usually shorter  and not as strong as true labor contractions.  Contractions are usually irregular.  Contractions are often felt in the front of the lower abdomen and in the groin.  Contractions may go away when you walk around or change positions while lying down.  Contractions get weaker and are shorter-lasting as time goes on.  The cervix usually does not dilate or become thin. Follow these instructions at home:  Take over-the-counter and prescription medicines only as told by your health care provider.  Keep up with your usual exercises and follow other instructions from your health care provider.  Eat and drink lightly if you think you are going into labor.  If Braxton Hicks contractions are making you uncomfortable: ? Change your position from lying down or resting to walking, or change from walking to resting. ? Sit and rest in a tub of warm water. ? Drink enough fluid to keep your urine pale yellow. Dehydration may cause these contractions. ? Do slow and deep breathing several times an hour.  Keep all follow-up prenatal visits as told by your health care provider. This is important.   Contact a health care provider if:  You have a fever.  You have continuous pain in your abdomen. Get help right away if:  Your contractions become stronger, more regular, and closer together.  You have fluid leaking or gushing from your vagina.  You pass blood-tinged mucus (bloody show).  You have bleeding from your vagina.  You have low back pain that you never had before.  You feel your baby's head pushing down and causing pelvic pressure.  Your baby is not moving inside   you as much as it used to. Summary  Contractions that occur before labor are called Braxton Hicks contractions, false labor, or practice contractions.  Braxton Hicks contractions are usually shorter, weaker, farther apart, and less regular than true labor contractions. True labor contractions usually become progressively  stronger and regular, and they become more frequent.  Manage discomfort from Braxton Hicks contractions by changing position, resting in a warm bath, drinking plenty of water, or practicing deep breathing. This information is not intended to replace advice given to you by your health care provider. Make sure you discuss any questions you have with your health care provider. Document Revised: 12/25/2016 Document Reviewed: 05/28/2016 Elsevier Patient Education  2021 Elsevier Inc.   Fetal Movement Counts Patient Name: ________________________________________________ Patient Due Date: ____________________  What is a fetal movement count? A fetal movement count is the number of times that you feel your baby move during a certain amount of time. This may also be called a fetal kick count. A fetal movement count is recommended for every pregnant woman. You may be asked to start counting fetal movements as early as week 28 of your pregnancy. Pay attention to when your baby is most active. You may notice your baby's sleep and wake cycles. You may also notice things that make your baby move more. You should do a fetal movement count:  When your baby is normally most active.  At the same time each day. A good time to count movements is while you are resting, after having something to eat and drink. How do I count fetal movements? 1. Find a quiet, comfortable area. Sit, or lie down on your side. 2. Write down the date, the start time and stop time, and the number of movements that you felt between those two times. Take this information with you to your health care visits. 3. Write down your start time when you feel the first movement. 4. Count kicks, flutters, swishes, rolls, and jabs. You should feel at least 10 movements. 5. You may stop counting after you have felt 10 movements, or if you have been counting for 2 hours. Write down the stop time. 6. If you do not feel 10 movements in 2 hours, contact  your health care provider for further instructions. Your health care provider may want to do additional tests to assess your baby's well-being. Contact a health care provider if:  You feel fewer than 10 movements in 2 hours.  Your baby is not moving like he or she usually does. Date: ____________ Start time: ____________ Stop time: ____________ Movements: ____________ Date: ____________ Start time: ____________ Stop time: ____________ Movements: ____________ Date: ____________ Start time: ____________ Stop time: ____________ Movements: ____________ Date: ____________ Start time: ____________ Stop time: ____________ Movements: ____________ Date: ____________ Start time: ____________ Stop time: ____________ Movements: ____________ Date: ____________ Start time: ____________ Stop time: ____________ Movements: ____________ Date: ____________ Start time: ____________ Stop time: ____________ Movements: ____________ Date: ____________ Start time: ____________ Stop time: ____________ Movements: ____________ Date: ____________ Start time: ____________ Stop time: ____________ Movements: ____________ This information is not intended to replace advice given to you by your health care provider. Make sure you discuss any questions you have with your health care provider. Document Revised: 09/01/2018 Document Reviewed: 09/01/2018 Elsevier Patient Education  2021 Elsevier Inc.  

## 2020-06-13 NOTE — MAU Provider Note (Signed)
History     CSN: [id number]  Arrival date and time: 06/13/20 0102   Event Date/Time   First Provider Initiated Contact with Patient 06/13/20 2038      Chief Complaint  Patient presents with  . Contractions  . Decreased Fetal Movement   HPI [name] is a 35 y.o. G2P1001 at [redacted]w[redacted]d with di/di twins who presents with abdominal cramping & decreased fetal movement of baby A.  Cramping is constant & started about 2 hours ago. Abdominal pain is associated with right lower back pain. Rates pain 7/10. Nothing makes pain better or worse. Hasn't treated symptoms. Denies n/v/d, dysuria, hematuria, vaginal bleeding, or leaking fluid. No recent intercourse.   OB History    Gravida  2   Para  1   Term  1   Preterm      AB      Living  1     SAB      IAB      Ectopic      Multiple  0   Live Births  1           Past Medical History:  Diagnosis Date  . Diabetes mellitus without complication (HCC)   . Gestational diabetes   . Newborn product of in vitro fertilization (IVF) pregnancy     Past Surgical History:  Procedure Laterality Date  . DILATION AND CURETTAGE OF UTERUS      Family History  Problem Relation Age of Onset  . Thyroid disease Mother   . Hypertension Father   . Diabetes Paternal Grandmother   . Breast cancer Paternal Grandmother   . Heart disease Paternal Grandfather     Social History   Tobacco Use  . Smoking status: Never Smoker  . Smokeless tobacco: Never Used  Substance Use Topics  . Alcohol use: Not Currently  . Drug use: Never    Allergies:  Allergies  Allergen Reactions  . Cinnamon Rash  . Ceclor [Cefaclor]     Medications Prior to Admission  Medication Sig Dispense Refill Last Dose  . acetaminophen (TYLENOL) 325 MG tablet Take 650 mg by mouth every 6 (six) hours as needed.   06/13/2020 at Unknown time  . cholecalciferol (VITAMIN D3) 25 MCG (1000 UNIT) tablet Take 1,000 Units by mouth daily.     . folic acid (FOLVITE) 1 MG  tablet Take 1 mg by mouth daily.     . Prenatal Vit-Fe Fumarate-FA (PRENATAL MULTIVITAMIN) TABS tablet Take 1 tablet by mouth daily at 12 noon.   06/13/2020 at Unknown time  . albuterol (PROVENTIL) (2.5 MG/3ML) 0.083% nebulizer solution Take 2.5 mg by nebulization every 4 (four) hours as needed.     Marland Kitchen albuterol (VENTOLIN HFA) 108 (90 Base) MCG/ACT inhaler Inhale 2 puffs into the lungs every 4 (four) hours as needed.     Marland Kitchen ascorbic acid (VITAMIN C) 100 MG tablet Take by mouth.     . B-D INS SYR ULTRAFINE 1CC/30G 30G X 1/2" 1 ML MISC FOR USE TO INJECT HEPARIN TWICE A DAY     . enoxaparin (LOVENOX) 40 MG/0.4ML injection Inject 40 mg into the skin daily.     . progesterone (PROMETRIUM) 200 MG capsule Prometrium 200 mg capsule  Take 1 capsule every day by oral route for 12 days.     . progesterone 50 MG/ML injection SMARTSIG:Milliliter(s) IM       Review of Systems  Constitutional: Negative.   Gastrointestinal: Positive for abdominal pain. Negative for constipation, diarrhea,  nausea and vomiting.  Genitourinary: Negative.   Musculoskeletal: Positive for back pain.   Physical Exam   Blood pressure 111/75, pulse 97, temperature 97.8 F (36.6 C), resp. rate 20, height 5\' 6"  (1.676 m), weight 78 kg, SpO2 99 %, unknown if currently breastfeeding.  Physical Exam Vitals and nursing note reviewed. Exam conducted with a chaperone present.  Constitutional:      General: She is not in acute distress.    Appearance: Normal appearance. She is normal weight. She is not ill-appearing.  HENT:     Head: Normocephalic and atraumatic.  Eyes:     General: No scleral icterus. Pulmonary:     Effort: Pulmonary effort is normal. No respiratory distress.  Abdominal:     Palpations: Abdomen is soft.     Tenderness: There is no right CVA tenderness or left CVA tenderness.     Comments: Gravid uterus. Ctx palpate mild. Soft between ctx.   Genitourinary:    Comments: Dilation: 1 Effacement (%): Thick Cervical  Position: Posterior Station: -3 Presentation: Vertex Exam by:: [email or web address] NP  Skin:    General: Skin is warm and dry.  Neurological:     Mental Status: She is alert.  Psychiatric:        Mood and Affect: Mood normal.        Behavior: Behavior normal.    Fetal Tracing: Baby A Baseline: 125 Variability: moderate Accelerations: 15x15 Decelerations: none  Baby B Baseline: 130 Variability: moderate Accelerations:15x15 Decelerations: none  Toco: Q 2-3 minutes  MAU Course  Procedures Results for orders placed or performed during the hospital encounter of 06/13/20 (from the past 24 hour(s))  Urinalysis, Routine w reflex microscopic Urine, Clean Catch     Status: Abnormal   Collection Time: 06/13/20  8:14 PM  Result Value Ref Range   Color, Urine YELLOW YELLOW   APPearance CLOUDY (A) CLEAR   Specific Gravity, Urine 1.011 1.005 - 1.030   pH 6.0 5.0 - 8.0   Glucose, UA NEGATIVE NEGATIVE mg/dL   Hgb urine dipstick NEGATIVE NEGATIVE   Bilirubin Urine NEGATIVE NEGATIVE   Ketones, ur 20 (A) NEGATIVE mg/dL   Protein, ur NEGATIVE NEGATIVE mg/dL   Nitrite NEGATIVE NEGATIVE   Leukocytes,Ua MODERATE (A) NEGATIVE   RBC / HPF 0-5 0 - 5 RBC/hpf   WBC, UA 6-10 0 - 5 WBC/hpf   Bacteria, UA MANY (A) NONE SEEN   Squamous Epithelial / LPF >50 (H) 0 - 5   Mucus PRESENT   Fetal fibronectin     Status: None   Collection Time: 06/13/20  8:40 PM  Result Value Ref Range   Fetal Fibronectin NEGATIVE NEGATIVE    MDM Patient presents with abdominal pain & back pain. Initially appears to be uncomfortable & contractions are tracing every 2-3 minutes on the monitor. Cervix 1/thick/-3. Given IV LR bolus & procardia 10 mg Q 20 minutes x 3 doses; given fentanyl 50 mcg for comfort. Ctx decreased after interventions.   Fetal fibronectin negative & cervix unchanged after more than 2 hours of monitoring.  Patient given additional dose of fentanyl as well as flexeril per consult with Dr. 06/15/20.  Patient reports feeling much better and stable for discharge home. She has f/u with her ob/gyn on Monday.   Urinalysis shows moderate leuks but appears to be contaminated. Patient denies urinary complaints. No CVA tenderness & is afebrile in MAU. Will send urine for culture.   Reactive fetal tracing x 2 & patient reports improvement in fetal  movement in MAU.   Assessment and Plan   1. Preterm uterine contractions in third trimester, antepartum   2. Dichorionic diamniotic twin pregnancy in third trimester   3. [redacted] weeks gestation of pregnancy   -Rx procardia & flexeril -reviewed PTL precautions & reasons to return to MAU -urine culture pending -keep appt with ob on Monday   Judeth Horn 06/14/2020, 12:17 AM

## 2020-06-14 NOTE — Progress Notes (Signed)
Pt was discharged and unable to waste medication in [place]. Per Channing Mutters in main pharmacy, RN allowed to make a note of disposal of unused medication w/ witness.  Medication:   Fentanyl 100 mcg/39ml Given: 50 mcg Waste: 1 ml  Fentanyl 100 mcg/34ml Given: 50 mcg Waste: 1 ml  Total waste of Fentanyl: 2 ml  Wayna Chalet, RNC-OB  Witness:  Isabel Caprice, RN

## 2020-06-15 LAB — CULTURE, OB URINE: Special Requests: NORMAL

## 2020-07-10 ENCOUNTER — Observation Stay (HOSPITAL_COMMUNITY): Payer: 59 | Admitting: Anesthesiology

## 2020-07-10 ENCOUNTER — Encounter (HOSPITAL_COMMUNITY)
Admission: AD | Disposition: A | Discharge status: Home / Self Care | Payer: Self-pay | Source: Home / Self Care | Attending: Obstetrics and Gynecology

## 2020-07-10 ENCOUNTER — Encounter (HOSPITAL_COMMUNITY): Payer: Self-pay | Admitting: Obstetrics and Gynecology

## 2020-07-10 ENCOUNTER — Inpatient Hospital Stay (HOSPITAL_COMMUNITY)
Admission: AD | Admit: 2020-07-10 | Discharge: 2020-07-13 | DRG: 783 | Disposition: A | Discharge status: Home / Self Care | Payer: 59 | Attending: Obstetrics and Gynecology | Admitting: Obstetrics and Gynecology

## 2020-07-10 DIAGNOSIS — O24420 Gestational diabetes mellitus in childbirth, diet controlled: Principal | ICD-10-CM | POA: Diagnosis present

## 2020-07-10 DIAGNOSIS — Z20822 Contact with and (suspected) exposure to covid-19: Secondary | ICD-10-CM | POA: Diagnosis present

## 2020-07-10 DIAGNOSIS — Z3A35 35 weeks gestation of pregnancy: Secondary | ICD-10-CM

## 2020-07-10 DIAGNOSIS — O99892 Other specified diseases and conditions complicating childbirth: Secondary | ICD-10-CM | POA: Diagnosis present

## 2020-07-10 DIAGNOSIS — M62830 Muscle spasm of back: Secondary | ICD-10-CM | POA: Diagnosis present

## 2020-07-10 DIAGNOSIS — O30009 Twin pregnancy, unspecified number of placenta and unspecified number of amniotic sacs, unspecified trimester: Secondary | ICD-10-CM | POA: Diagnosis present

## 2020-07-10 DIAGNOSIS — O9081 Anemia of the puerperium: Secondary | ICD-10-CM | POA: Diagnosis not present

## 2020-07-10 DIAGNOSIS — O368190 Decreased fetal movements, unspecified trimester, not applicable or unspecified: Secondary | ICD-10-CM

## 2020-07-10 DIAGNOSIS — Z302 Encounter for sterilization: Secondary | ICD-10-CM

## 2020-07-10 DIAGNOSIS — O26893 Other specified pregnancy related conditions, third trimester: Secondary | ICD-10-CM | POA: Diagnosis present

## 2020-07-10 DIAGNOSIS — O30043 Twin pregnancy, dichorionic/diamniotic, third trimester: Secondary | ICD-10-CM | POA: Diagnosis present

## 2020-07-10 LAB — TYPE AND SCREEN
ABO/RH(D): A POS
Antibody Screen: NEGATIVE

## 2020-07-10 LAB — RESP PANEL BY RT-PCR (FLU A&B, COVID) ARPGX2
Influenza A by PCR: NEGATIVE
Influenza B by PCR: NEGATIVE
SARS Coronavirus 2 by RT PCR: NEGATIVE

## 2020-07-10 LAB — CBC
HCT: 31.5 % — ABNORMAL LOW (ref 36.0–46.0)
Hemoglobin: 10.4 g/dL — ABNORMAL LOW (ref 12.0–15.0)
MCH: 27.1 pg (ref 26.0–34.0)
MCHC: 33 g/dL (ref 30.0–36.0)
MCV: 82 fL (ref 80.0–100.0)
Platelets: 173 10*3/uL (ref 150–400)
RBC: 3.84 MIL/uL — ABNORMAL LOW (ref 3.87–5.11)
RDW: 14 % (ref 11.5–15.5)
WBC: 8.3 10*3/uL (ref 4.0–10.5)
nRBC: 0 % (ref 0.0–0.2)

## 2020-07-10 LAB — GLUCOSE, CAPILLARY: Glucose-Capillary: 97 mg/dL (ref 70–99)

## 2020-07-10 SURGERY — Surgical Case
Anesthesia: Spinal

## 2020-07-10 MED ORDER — KETOROLAC TROMETHAMINE 30 MG/ML IJ SOLN
INTRAMUSCULAR | Status: AC
  Filled 2020-07-10: qty 1

## 2020-07-10 MED ORDER — SOD CITRATE-CITRIC ACID 500-334 MG/5ML PO SOLN
ORAL | Status: AC
  Administered 2020-07-10: 30 mL
  Filled 2020-07-10: qty 15

## 2020-07-10 MED ORDER — OXYTOCIN-SODIUM CHLORIDE 30-0.9 UT/500ML-% IV SOLN
INTRAVENOUS | Status: DC | PRN
  Administered 2020-07-10: 30 [IU] via INTRAVENOUS

## 2020-07-10 MED ORDER — DIPHENHYDRAMINE HCL 50 MG/ML IJ SOLN: 12.5000 mg | INTRAMUSCULAR | Status: DC | PRN

## 2020-07-10 MED ORDER — ONDANSETRON HCL 4 MG/2ML IJ SOLN
INTRAMUSCULAR | Status: DC | PRN
  Administered 2020-07-10: 4 mg via INTRAVENOUS

## 2020-07-10 MED ORDER — NALBUPHINE HCL 10 MG/ML IJ SOLN
5.0000 mg | Freq: Once | INTRAMUSCULAR | Status: AC | PRN
  Administered 2020-07-11: 5 mg via INTRAVENOUS
  Filled 2020-07-10: qty 1

## 2020-07-10 MED ORDER — BUPIVACAINE IN DEXTROSE 0.75-8.25 % IT SOLN
INTRATHECAL | Status: DC | PRN
  Administered 2020-07-10: 1.6 mL via INTRATHECAL

## 2020-07-10 MED ORDER — NALOXONE HCL 0.4 MG/ML IJ SOLN: 0.4000 mg | INTRAMUSCULAR | Status: DC | PRN

## 2020-07-10 MED ORDER — NALBUPHINE HCL 10 MG/ML IJ SOLN: 5.0000 mg | INTRAMUSCULAR | Status: DC | PRN

## 2020-07-10 MED ORDER — SODIUM CHLORIDE 0.9% FLUSH: 3.0000 mL | INTRAVENOUS | Status: DC | PRN

## 2020-07-10 MED ORDER — CALCIUM CARBONATE ANTACID 500 MG PO CHEW: 2.0000 | CHEWABLE_TABLET | ORAL | Status: DC | PRN

## 2020-07-10 MED ORDER — KETOROLAC TROMETHAMINE 30 MG/ML IJ SOLN
30.0000 mg | Freq: Four times a day (QID) | INTRAMUSCULAR | Status: AC | PRN
  Administered 2020-07-11: 30 mg via INTRAVENOUS
  Filled 2020-07-10: qty 1

## 2020-07-10 MED ORDER — ACETAMINOPHEN 325 MG PO TABS
650.0000 mg | ORAL_TABLET | ORAL | Status: DC | PRN
  Administered 2020-07-10: 650 mg via ORAL
  Filled 2020-07-10: qty 2

## 2020-07-10 MED ORDER — BETAMETHASONE SOD PHOS & ACET 6 (3-3) MG/ML IJ SUSP
12.0000 mg | INTRAMUSCULAR | Status: DC
  Administered 2020-07-10: 12 mg via INTRAMUSCULAR
  Filled 2020-07-10: qty 5

## 2020-07-10 MED ORDER — ZOLPIDEM TARTRATE 5 MG PO TABS: 5.0000 mg | ORAL_TABLET | Freq: Every evening | ORAL | Status: DC | PRN

## 2020-07-10 MED ORDER — PHENYLEPHRINE HCL-NACL 20-0.9 MG/250ML-% IV SOLN
INTRAVENOUS | Status: DC | PRN
  Administered 2020-07-10: 60 ug/min via INTRAVENOUS

## 2020-07-10 MED ORDER — MEPERIDINE HCL 25 MG/ML IJ SOLN: 6.2500 mg | INTRAMUSCULAR | Status: DC | PRN

## 2020-07-10 MED ORDER — NALOXONE HCL 4 MG/10ML IJ SOLN
1.0000 ug/kg/h | INTRAVENOUS | Status: DC | PRN
  Filled 2020-07-10: qty 5

## 2020-07-10 MED ORDER — PHENYLEPHRINE HCL-NACL 20-0.9 MG/250ML-% IV SOLN
INTRAVENOUS | Status: AC
  Filled 2020-07-10: qty 250

## 2020-07-10 MED ORDER — DIPHENHYDRAMINE HCL 50 MG/ML IJ SOLN
INTRAMUSCULAR | Status: DC | PRN
  Administered 2020-07-10: 25 mg via INTRAVENOUS

## 2020-07-10 MED ORDER — LACTATED RINGERS IV SOLN: INTRAVENOUS | Status: DC

## 2020-07-10 MED ORDER — FENTANYL CITRATE (PF) 100 MCG/2ML IJ SOLN
INTRAMUSCULAR | Status: DC | PRN
  Administered 2020-07-10: 15 ug via INTRATHECAL

## 2020-07-10 MED ORDER — CYCLOBENZAPRINE HCL 10 MG PO TABS
5.0000 mg | ORAL_TABLET | Freq: Three times a day (TID) | ORAL | Status: DC | PRN
  Administered 2020-07-10: 5 mg via ORAL
  Filled 2020-07-10: qty 1

## 2020-07-10 MED ORDER — CLINDAMYCIN PHOSPHATE 900 MG/50ML IV SOLN
INTRAVENOUS | Status: AC
  Filled 2020-07-10: qty 50

## 2020-07-10 MED ORDER — GENTAMICIN SULFATE 40 MG/ML IJ SOLN
5.0000 mg/kg | INTRAVENOUS | Status: AC
  Administered 2020-07-10: 340 mg via INTRAVENOUS
  Filled 2020-07-10: qty 8.5

## 2020-07-10 MED ORDER — PRENATAL MULTIVITAMIN CH: 1.0000 | ORAL_TABLET | Freq: Every day | ORAL | Status: DC

## 2020-07-10 MED ORDER — FENTANYL CITRATE (PF) 100 MCG/2ML IJ SOLN
INTRAMUSCULAR | Status: AC
  Filled 2020-07-10: qty 2

## 2020-07-10 MED ORDER — ONDANSETRON HCL 4 MG/2ML IJ SOLN: 4.0000 mg | Freq: Three times a day (TID) | INTRAMUSCULAR | Status: DC | PRN

## 2020-07-10 MED ORDER — SODIUM CHLORIDE 0.9 % IR SOLN
Status: DC | PRN
  Administered 2020-07-10 (×2): 1

## 2020-07-10 MED ORDER — ONDANSETRON HCL 4 MG/2ML IJ SOLN
INTRAMUSCULAR | Status: AC
  Filled 2020-07-10: qty 2

## 2020-07-10 MED ORDER — DIPHENHYDRAMINE HCL 25 MG PO CAPS: 25.0000 mg | ORAL_CAPSULE | ORAL | Status: DC | PRN

## 2020-07-10 MED ORDER — LACTATED RINGERS IV SOLN: INTRAVENOUS | Status: DC | PRN

## 2020-07-10 MED ORDER — NALBUPHINE HCL 10 MG/ML IJ SOLN: 5.0000 mg | Freq: Once | INTRAMUSCULAR | Status: AC | PRN

## 2020-07-10 MED ORDER — FENTANYL CITRATE (PF) 100 MCG/2ML IJ SOLN
25.0000 ug | INTRAMUSCULAR | Status: DC | PRN
Start: 2020-07-10 — End: 2020-07-11

## 2020-07-10 MED ORDER — KETOROLAC TROMETHAMINE 30 MG/ML IJ SOLN: 30.0000 mg | Freq: Four times a day (QID) | INTRAMUSCULAR | Status: AC | PRN

## 2020-07-10 MED ORDER — MORPHINE SULFATE (PF) 0.5 MG/ML IJ SOLN
INTRAMUSCULAR | Status: AC
  Filled 2020-07-10: qty 10

## 2020-07-10 MED ORDER — MORPHINE SULFATE (PF) 0.5 MG/ML IJ SOLN
INTRAMUSCULAR | Status: DC | PRN
  Administered 2020-07-10: .15 mg via INTRATHECAL

## 2020-07-10 MED ORDER — DOCUSATE SODIUM 100 MG PO CAPS: 100.0000 mg | ORAL_CAPSULE | Freq: Every day | ORAL | Status: DC

## 2020-07-10 MED ORDER — CLINDAMYCIN PHOSPHATE 900 MG/50ML IV SOLN
900.0000 mg | INTRAVENOUS | Status: AC
Start: 2020-07-10 — End: 2020-07-10
  Administered 2020-07-10: 900 mg via INTRAVENOUS

## 2020-07-10 MED ORDER — DIPHENHYDRAMINE HCL 50 MG/ML IJ SOLN
INTRAMUSCULAR | Status: AC
  Filled 2020-07-10: qty 1

## 2020-07-10 SURGICAL SUPPLY — 38 items
APL SKNCLS STERI-STRIP NONHPOA (GAUZE/BANDAGES/DRESSINGS) ×1
BENZOIN TINCTURE PRP APPL 2/3 (GAUZE/BANDAGES/DRESSINGS) ×2 IMPLANT
CHLORAPREP W/TINT 26ML (MISCELLANEOUS) ×2 IMPLANT
CLAMP CORD UMBIL (MISCELLANEOUS) IMPLANT
CLOSURE STERI STRIP 1/2 X4 (GAUZE/BANDAGES/DRESSINGS) ×1 IMPLANT
CLOTH BEACON ORANGE TIMEOUT ST (SAFETY) ×2 IMPLANT
CLSR STERI-STRIP ANTIMIC 1/2X4 (GAUZE/BANDAGES/DRESSINGS) ×2 IMPLANT
DRSG OPSITE POSTOP 4X10 (GAUZE/BANDAGES/DRESSINGS) ×2 IMPLANT
DRSG PAD ABDOMINAL 8X10 ST (GAUZE/BANDAGES/DRESSINGS) ×1 IMPLANT
ELECT REM PT RETURN 9FT ADLT (ELECTROSURGICAL) ×2
ELECTRODE REM PT RTRN 9FT ADLT (ELECTROSURGICAL) ×1 IMPLANT
EXTRACTOR VACUUM KIWI (MISCELLANEOUS) IMPLANT
GAUZE SPONGE 4X4 12PLY STRL LF (GAUZE/BANDAGES/DRESSINGS) ×2 IMPLANT
GLOVE BIO SURGEON STRL SZ 6.5 (GLOVE) ×2 IMPLANT
GLOVE BIOGEL PI IND STRL 6.5 (GLOVE) ×1 IMPLANT
GLOVE BIOGEL PI IND STRL 7.0 (GLOVE) ×2 IMPLANT
GLOVE BIOGEL PI INDICATOR 6.5 (GLOVE) ×1
GLOVE BIOGEL PI INDICATOR 7.0 (GLOVE) ×2
GOWN STRL REUS W/TWL LRG LVL3 (GOWN DISPOSABLE) ×4 IMPLANT
KIT ABG SYR 3ML LUER SLIP (SYRINGE) ×2 IMPLANT
NDL HYPO 25X5/8 SAFETYGLIDE (NEEDLE) ×1 IMPLANT
NEEDLE HYPO 25X5/8 SAFETYGLIDE (NEEDLE) ×2 IMPLANT
NS IRRIG 1000ML POUR BTL (IV SOLUTION) ×2 IMPLANT
PACK C SECTION WH (CUSTOM PROCEDURE TRAY) ×2 IMPLANT
PAD OB MATERNITY 4.3X12.25 (PERSONAL CARE ITEMS) ×2 IMPLANT
PENCIL SMOKE EVAC W/HOLSTER (ELECTROSURGICAL) ×2 IMPLANT
SET BERKELEY SUCTION TUBING (SUCTIONS) ×1 IMPLANT
SUT PLAIN 0 NONE (SUTURE) IMPLANT
SUT PLAIN 2 0 (SUTURE) ×4
SUT PLAIN ABS 2-0 CT1 27XMFL (SUTURE) ×1 IMPLANT
SUT VIC AB 0 CT1 36 (SUTURE) ×2 IMPLANT
SUT VIC AB 0 CTX 36 (SUTURE) ×4
SUT VIC AB 0 CTX36XBRD ANBCTRL (SUTURE) ×2 IMPLANT
SUT VIC AB 4-0 PS2 27 (SUTURE) ×2 IMPLANT
TOWEL OR 17X24 6PK STRL BLUE (TOWEL DISPOSABLE) ×2 IMPLANT
TRAY FOLEY W/BAG SLVR 14FR LF (SET/KITS/TRAYS/PACK) IMPLANT
WATER STERILE IRR 1000ML POUR (IV SOLUTION) ×2 IMPLANT
YANKAUER SUCT BULB TIP NO VENT (SUCTIONS) ×1 IMPLANT

## 2020-07-10 NOTE — Progress Notes (Signed)
Dr. Henderson Cloud called for laboring patient. C/S decision time 2121. MD to notify OR.

## 2020-07-10 NOTE — MAU Note (Signed)
Pt sent from office for eval. Twin A d/t BPP  (0 - breathing & movement).

## 2020-07-10 NOTE — H&P (Signed)
[name] is a 35 y.o. female presenting for Di/Di IVF pregnancy. Today in office U/S notes A>Vtx, BPP 4/8 (2 off for FM and breathing), B>transverse, BPP 8/8. Patient sent to MAU for further evaluation. She denies ROM, vaginal bleeding. Cx = 2/30 on 07/04/20. She has had progressive back spasm and general muscular discomfort steadily increasing over last month or so. Has taken Flexeril with some relief. Pregnancy also complicated by AMA> Panorama normal x 2 (boys), IVF pregnancy>declined echo, A1GDM, PAI 4G/5G>heparin first trimester-no anticoagulation since then, asthma. OB History     Gravida  2   Para  1   Term  1   Preterm      AB      Living  1      SAB      IAB      Ectopic      Multiple  0   Live Births  1          Past Medical History:  Diagnosis Date   Diabetes mellitus without complication (HCC)    Gestational diabetes    Newborn product of in vitro fertilization (IVF) pregnancy    Past Surgical History:  Procedure Laterality Date   DILATION AND CURETTAGE OF UTERUS     Family History: family history includes Breast cancer in her paternal grandmother; Diabetes in her paternal grandmother; Heart disease in her paternal grandfather; Hypertension in her father; Thyroid disease in her mother. Social History:  reports that she has never smoked. She has never used smokeless tobacco. She reports previous alcohol use. She reports that she does not use drugs.     Maternal Diabetes: Yes:  Diabetes Type:  Diet controlled Genetic Screening: Normal Maternal Ultrasounds/Referrals: Normal Fetal Ultrasounds or other Referrals:  None Maternal Substance Abuse:  No Significant Maternal Medications:  None Significant Maternal Lab Results:  Other: taken in office today-result pending Other Comments:  None  Review of Systems  Constitutional:  Negative for fever.  Eyes:  Negative for visual disturbance.  Neurological:  Negative for headaches.  History Dilation:  2 Exam by:: Dr Henderson Cloud Blood pressure 127/83, pulse 88, temperature 99.1 F (37.3 C), temperature source Oral, weight 81.5 kg, SpO2 99 %, unknown if currently breastfeeding.   Fetal Exam Fetal State Assessment: Category I - tracings are normal. FHT cat one x 2  Physical Exam Cardiovascular:     Rate and Rhythm: Normal rate.  Pulmonary:     Effort: Pulmonary effort is normal.    Back without CVAT Abdomen uterus soft  Prenatal labs: ABO, Rh:   Antibody:   Rubella:   RPR:    HBsAg:    HIV:    GBS:     Assessment/Plan: 35 yo IVF twins @ 35 3/7 wks BPP A=6/10         B=10/10 Back spasm progressively worse. She does get some relief with Flexeril. She has pain with movement of Baby B but no evidence of regular UCs, no cervical change, no signs of placental abruption. Both fetal tracings are reassuring.  I D/W Dr Parke Poisson, MFM, who recommends overnight observation with continuous FM, BMZ, and repeat BPP in am. IV fluids ordered and NPO after midnight. Flexeril, Tylenol, K pad to back prn. She wants C/S and BTL for delivery regardless of fetal position.   [name] II 07/10/2020, 8:06 PM

## 2020-07-10 NOTE — MAU Provider Note (Addendum)
Patient [name] is a 35 y.o. G2P1001 at [redacted]w[redacted]d here after being seen at [facility] today and reporting a BPP 4/8 in twin A  (-2 for breathing and -2 for movement) and an 8/8 in Twin B. An NST was not performed in the office today. The BPP was performed at 3:45 pm today. She was seen by Dr. Langston Masker today.   Patient denies vaginal bleeding, LOF of fluid. She reports that she frequently has contractions, and back pain. She reports strong movement. She denies any other complaints.   Her pregnancy history is complicated by GDMA1 diabetic, not on medicine.   Per TC with Dr. Langston Masker, baby A and baby B have no discordance and have been growing well; Baby A weighed 2459 grams and Baby B weighed 2486 grams   History     CSN: [id number]  Arrival date and time: 07/10/20 1720   Event Date/Time   First Provider Initiated Contact with Patient 07/10/20 1816      Chief Complaint  Patient presents with   Decreased Fetal Movement   HPI  OB History     Gravida  2   Para  1   Term  1   Preterm      AB      Living  1      SAB      IAB      Ectopic      Multiple  0   Live Births  1           Past Medical History:  Diagnosis Date   Diabetes mellitus without complication (HCC)    Gestational diabetes    Newborn product of in vitro fertilization (IVF) pregnancy     Past Surgical History:  Procedure Laterality Date   DILATION AND CURETTAGE OF UTERUS      Family History  Problem Relation Age of Onset   Thyroid disease Mother    Hypertension Father    Diabetes Paternal Grandmother    Breast cancer Paternal Grandmother    Heart disease Paternal Grandfather     Social History   Tobacco Use   Smoking status: Never   Smokeless tobacco: Never  Substance Use Topics   Alcohol use: Not Currently   Drug use: Never    Allergies:  Allergies  Allergen Reactions   Cinnamon Rash   Ceclor [Cefaclor]     Medications Prior to Admission  Medication Sig  Dispense Refill Last Dose   acetaminophen (TYLENOL) 325 MG tablet Take 650 mg by mouth every 6 (six) hours as needed.   07/09/2020   ascorbic acid (VITAMIN C) 100 MG tablet Take by mouth.   07/10/2020   cholecalciferol (VITAMIN D3) 25 MCG (1000 UNIT) tablet Take 1,000 Units by mouth daily.   07/10/2020   cyclobenzaprine (FLEXERIL) 5 MG tablet Take 1 tablet (5 mg total) by mouth 3 (three) times daily as needed for muscle spasms. 20 tablet 0 Past Week   folic acid (FOLVITE) 1 MG tablet Take 1 mg by mouth daily.   07/10/2020   NIFEdipine (PROCARDIA) 10 MG capsule Take 1 capsule (10 mg total) by mouth every 4 (four) hours as needed (more than 6 painful contractions per hour). 30 capsule 0 Past Week   Prenatal Vit-Fe Fumarate-FA (PRENATAL MULTIVITAMIN) TABS tablet Take 1 tablet by mouth daily at 12 noon.   07/10/2020   albuterol (PROVENTIL) (2.5 MG/3ML) 0.083% nebulizer solution Take 2.5 mg by nebulization every 4 (four) hours as needed.  Unknown   albuterol (VENTOLIN HFA) 108 (90 Base) MCG/ACT inhaler Inhale 2 puffs into the lungs every 4 (four) hours as needed.   Unknown   B-D INS SYR ULTRAFINE 1CC/30G 30G X 1/2" 1 ML MISC FOR USE TO INJECT HEPARIN TWICE A DAY      enoxaparin (LOVENOX) 40 MG/0.4ML injection Inject 40 mg into the skin daily.       Review of Systems  Constitutional: Negative.   HENT: Negative.    Cardiovascular: Negative.   Gastrointestinal: Negative.   Genitourinary: Negative.   Neurological: Negative.   Physical Exam   Blood pressure 127/83, pulse 88, temperature 99.1 F (37.3 C), temperature source Oral, weight 81.5 kg, SpO2 99 %, unknown if currently breastfeeding.  Physical Exam Constitutional:      Appearance: Normal appearance.  HENT:     Head: Normocephalic.  Abdominal:     General: Abdomen is flat.  Genitourinary:    General: Normal vulva.  Musculoskeletal:        General: Normal range of motion.  Skin:    General: Skin is warm.  Neurological:     General: No  focal deficit present.     Mental Status: She is alert.  Cervix is 2 cm/thick/middle position  MAU Course  Procedures  MDM  -NST: 135 bpm, mod var, present acel, no decels, no contractions NST: 140 bpm, mod var, present acel, no decels, no contractions -Patient  Assessment and Plan  1845: TC to Dr. Henderson Cloud to discuss; Dr. Henderson Cloud will discuss with Dr. Parke Poisson 1945: Patient to be admitted and repeat BPP in the morning, consider delivery if concern for fetal well-being.   Charlesetta Garibaldi Maximos Zayas 07/10/2020, 6:25 PM

## 2020-07-10 NOTE — Brief Op Note (Signed)
07/10/2020  11:30 PM  PATIENT:  [name]  35 y.o. female  PRE-OPERATIVE DIAGNOSIS:  TWINS, 1 VTX AND 1 TRANSVERSE, preterm labor, desires permanent sterilization  POST-OPERATIVE DIAGNOSIS:  TWINS, 1 VTX AND 1 TRANSVERSE, preterm labor,  desires permanent sterilization  PROCEDURE:  Procedure(s): PRIMARY CESAREAN SECTION EDC: 08-11-20 ALLERG: CECLOR (N/A) BTL SURGEON:  Surgeon(s) and Role:    * Harold Hedge, MD - Primary  PHYSICIAN ASSISTANT:   ASSISTANTS: none   ANESTHESIA:   spinal  EBL:  1059 ml   BLOOD ADMINISTERED:none  DRAINS: Urinary Catheter (Foley)   LOCAL MEDICATIONS USED:  NONE  SPECIMEN:  Source of Specimen:  placenta x 2, bilateral fallopian tube segments  DISPOSITION OF SPECIMEN:  PATHOLOGY  COUNTS:  YES  TOURNIQUET:  * No tourniquets in log *  DICTATION: .Other Dictation: Dictation Number [id number]  PLAN OF CARE: Admit to inpatient   PATIENT DISPOSITION:  PACU - hemodynamically stable.   Delay start of Pharmacological VTE agent (>24hrs) due to surgical blood loss or risk of bleeding: not applicable

## 2020-07-10 NOTE — Progress Notes (Signed)
Transferred to antenatal floor.  FHT cat one x 2 UCs painful q3-4 min  A/P: recommend delivery. Patient wants cesarean section and bilateral tubal ligation. D/W procedure and risks including infection, organ damage, bleeding/transfusion-HIV/Hep, DVT/PE, pneumonia, wound breakdown. D/W BTL - permanence, failure rate and increased ectopic risk. She states she understands and agrees. D/W neonatologist-nursery is full but patient not stable for transport.

## 2020-07-10 NOTE — Transfer of Care (Signed)
Immediate Anesthesia Transfer of Care Note  Patient: [name]  Procedure(s) Performed: PRIMARY CESAREAN SECTION EDC: 08-11-20 ALLERG: CECLOR  Patient Location: PACU  Anesthesia Type:Spinal  Level of Consciousness: awake  Airway & Oxygen Therapy: Patient Spontanous Breathing  Post-op Assessment: Report given to RN and Post -op Vital signs reviewed and stable  Post vital signs: Reviewed and stable  Last Vitals:  Vitals Value Taken Time  BP 105/68 07/10/20 2348  Temp 36.8 C 07/10/20 2349  Pulse 94 07/10/20 2352  Resp 14 07/10/20 2352  SpO2 100 % 07/10/20 2352  Vitals shown include unvalidated device data.  Last Pain:  Vitals:   07/10/20 2349  TempSrc: Oral  PainSc:          Complications: No notable events documented.

## 2020-07-10 NOTE — Anesthesia Preprocedure Evaluation (Addendum)
Anesthesia Evaluation  Patient identified by MRN, date of birth, ID band Patient awake    Reviewed: Allergy & Precautions, NPO status , Patient's Chart, lab work & pertinent test results  Airway Mallampati: II  TM Distance: >3 FB Neck ROM: Full    Dental no notable dental hx. (+) Teeth Intact, Dental Advisory Given   Pulmonary asthma ,    Pulmonary exam normal breath sounds clear to auscultation       Cardiovascular negative cardio ROS Normal cardiovascular exam Rhythm:Regular Rate:Normal     Neuro/Psych negative neurological ROS  negative psych ROS   GI/Hepatic Neg liver ROS, GERD  ,  Endo/Other  diabetes, Well Controlled, Gestational  Renal/GU negative Renal ROS  negative genitourinary   Musculoskeletal Back pain somewhat releived by Flexeril   Abdominal   Peds  Hematology  (+) anemia ,   Anesthesia Other Findings   Reproductive/Obstetrics AMA Di/Di Twin gestation 35 3/7 weeks Vertex/Transverse BPP 10/10 Twin B, 6/10 Twin A IVF Desires permanent sterilization                             Anesthesia Physical Anesthesia Plan  ASA: 2 and emergent  Anesthesia Plan: Spinal   Post-op Pain Management:    Induction:   PONV Risk Score and Plan: 4 or greater and Treatment may vary due to age or medical condition  Airway Management Planned: Natural Airway  Additional Equipment:   Intra-op Plan:   Post-operative Plan:   Informed Consent: I have reviewed the patients History and Physical, chart, labs and discussed the procedure including the risks, benefits and alternatives for the proposed anesthesia with the patient or authorized representative who has indicated his/her understanding and acceptance.     Dental advisory given  Plan Discussed with: CRNA and Anesthesiologist  Anesthesia Plan Comments:         Anesthesia Quick Evaluation

## 2020-07-11 ENCOUNTER — Encounter (HOSPITAL_COMMUNITY): Payer: Self-pay | Admitting: Obstetrics and Gynecology

## 2020-07-11 ENCOUNTER — Other Ambulatory Visit: Payer: Self-pay

## 2020-07-11 LAB — CBC
HCT: 27 % — ABNORMAL LOW (ref 36.0–46.0)
Hemoglobin: 8.8 g/dL — ABNORMAL LOW (ref 12.0–15.0)
MCH: 26.7 pg (ref 26.0–34.0)
MCHC: 32.6 g/dL (ref 30.0–36.0)
MCV: 81.8 fL (ref 80.0–100.0)
Platelets: 172 10*3/uL (ref 150–400)
RBC: 3.3 MIL/uL — ABNORMAL LOW (ref 3.87–5.11)
RDW: 13.6 % (ref 11.5–15.5)
WBC: 11.9 10*3/uL — ABNORMAL HIGH (ref 4.0–10.5)
nRBC: 0 % (ref 0.0–0.2)

## 2020-07-11 LAB — RPR: RPR Ser Ql: NONREACTIVE

## 2020-07-11 MED ORDER — SENNOSIDES-DOCUSATE SODIUM 8.6-50 MG PO TABS
2.0000 | ORAL_TABLET | Freq: Every day | ORAL | Status: DC
  Administered 2020-07-11 – 2020-07-13 (×3): 2 via ORAL
  Filled 2020-07-11 (×3): qty 2

## 2020-07-11 MED ORDER — WITCH HAZEL-GLYCERIN EX PADS
1.0000 | MEDICATED_PAD | CUTANEOUS | Status: DC | PRN
Start: 2020-07-11 — End: 2020-07-13

## 2020-07-11 MED ORDER — OXYCODONE HCL 5 MG PO TABS
5.0000 mg | ORAL_TABLET | ORAL | Status: DC | PRN
  Administered 2020-07-11 – 2020-07-12 (×2): 10 mg via ORAL
  Administered 2020-07-12: 5 mg via ORAL
  Administered 2020-07-12 (×2): 10 mg via ORAL
  Filled 2020-07-11 (×5): qty 2

## 2020-07-11 MED ORDER — SIMETHICONE 80 MG PO CHEW
80.0000 mg | CHEWABLE_TABLET | Freq: Three times a day (TID) | ORAL | Status: DC
  Administered 2020-07-11 – 2020-07-13 (×6): 80 mg via ORAL
  Filled 2020-07-11 (×6): qty 1

## 2020-07-11 MED ORDER — OXYTOCIN-SODIUM CHLORIDE 30-0.9 UT/500ML-% IV SOLN
INTRAVENOUS | Status: AC
  Filled 2020-07-11: qty 500

## 2020-07-11 MED ORDER — COCONUT OIL OIL: 1.0000 "application " | TOPICAL_OIL | Status: DC | PRN

## 2020-07-11 MED ORDER — LACTATED RINGERS IV SOLN: INTRAVENOUS | Status: DC

## 2020-07-11 MED ORDER — DIPHENHYDRAMINE HCL 25 MG PO CAPS: 25.0000 mg | ORAL_CAPSULE | Freq: Four times a day (QID) | ORAL | Status: DC | PRN

## 2020-07-11 MED ORDER — PRENATAL MULTIVITAMIN CH
1.0000 | ORAL_TABLET | Freq: Every day | ORAL | Status: DC
  Administered 2020-07-11 – 2020-07-12 (×2): 1 via ORAL
  Filled 2020-07-11 (×2): qty 1

## 2020-07-11 MED ORDER — SIMETHICONE 80 MG PO CHEW
80.0000 mg | CHEWABLE_TABLET | ORAL | Status: DC | PRN
Start: 2020-07-11 — End: 2020-07-13
  Administered 2020-07-13: 80 mg via ORAL
  Filled 2020-07-11: qty 1

## 2020-07-11 MED ORDER — DIBUCAINE (PERIANAL) 1 % EX OINT: 1.0000 "application " | TOPICAL_OINTMENT | CUTANEOUS | Status: DC | PRN

## 2020-07-11 MED ORDER — MENTHOL 3 MG MT LOZG: 1.0000 | LOZENGE | OROMUCOSAL | Status: DC | PRN

## 2020-07-11 MED ORDER — OXYTOCIN-SODIUM CHLORIDE 30-0.9 UT/500ML-% IV SOLN: 2.5000 [IU]/h | INTRAVENOUS | Status: AC

## 2020-07-11 MED ORDER — ACETAMINOPHEN 500 MG PO TABS
1000.0000 mg | ORAL_TABLET | Freq: Four times a day (QID) | ORAL | Status: DC
  Administered 2020-07-11 – 2020-07-13 (×10): 1000 mg via ORAL
  Filled 2020-07-11 (×10): qty 2

## 2020-07-11 MED ORDER — HYDROMORPHONE HCL 1 MG/ML IJ SOLN: 0.2000 mg | INTRAMUSCULAR | Status: DC | PRN

## 2020-07-11 MED ORDER — IBUPROFEN 600 MG PO TABS
600.0000 mg | ORAL_TABLET | Freq: Four times a day (QID) | ORAL | Status: DC | PRN
  Administered 2020-07-12 – 2020-07-13 (×2): 600 mg via ORAL
  Filled 2020-07-11 (×2): qty 1

## 2020-07-11 MED ORDER — ZOLPIDEM TARTRATE 5 MG PO TABS: 5.0000 mg | ORAL_TABLET | Freq: Every evening | ORAL | Status: DC | PRN

## 2020-07-11 MED ORDER — TETANUS-DIPHTH-ACELL PERTUSSIS 5-2.5-18.5 LF-MCG/0.5 IM SUSY: 0.5000 mL | PREFILLED_SYRINGE | Freq: Once | INTRAMUSCULAR | Status: DC

## 2020-07-11 NOTE — Progress Notes (Signed)
Patient screened out for psychosocial assessment since none of the following apply: °Psychosocial stressors documented in mother or baby's chart °Gestation less than 32 weeks °Code at delivery  °Infant with anomalies °Please contact the Clinical Social Worker if specific needs arise, by MOB's request, or if MOB scores greater than 9/yes to question 10 on Edinburgh Postpartum Depression Screen. ° °Almond Fitzgibbon Boyd-Gilyard, MSW, LCSW °Clinical Social Work °[phone] °  °

## 2020-07-11 NOTE — Op Note (Signed)
NAMEJACQUE, [name]: [id number] ACCOUNT [name]: [id number] DATE OF BIRTH: 11-18-1985 FACILITY: [facility] LOCATION: [facility] PHYSICIAN: Guy Sandifer. Arleta Creek, MD  Operative Report   DATE OF PROCEDURE: 07/10/2020  PREOPERATIVE DIAGNOSES: 1.  Twin pregnancy at 98 and 3/7th weeks. 2.  Preterm labor. 3.  Vertex transverse presentation. 4.  Desires permanent sterilization.  POSTOPERATIVE DIAGNOSES: 1.  Twin pregnancy at 76 and 3/7th weeks. 2.  Preterm labor. 3.  Vertex transverse presentation. 4.  Desires permanent sterilization.  PROCEDURE:  Primary low transverse cesarean section and bilateral tubal ligation.  SURGEON:  Retta Mac, MD  ANESTHESIA:  Mal Amabile, MD  SPECIMENS TO PATHOLOGY:  Both placentas and bilateral fallopian tube segments.  FINDINGS:  Baby A, viable female infant. Apgars, birth weight, arterial cord pH pending.  Baby B, viable female infant. Apgars, arterial cord pH, birth weight pending.  INDICATION AND CONSENT:  This patient is a 35 year old patient at 43 and 3/7th weeks.  This pregnancy is an in vitro fertilization pregnancy with di-di twins.  Today, ultrasound demonstrated baby A to be vertex and B to be transverse.  Biophysical  profile on A was 4/8 and on baby B was 8/8.  She was sent to MAU for further evaluation.  There, the fetal heartbeat was noted to be reassuring for both babies.  She continued to complain of increasing pain over the past 2-4 weeks of muscular pain, back  pain, etc.  Cervix was unchanged at 2 cm dilation.  Consultation with Dr. Parke Poisson of maternal fetal medicine was carried out and the plan was admission, close observation, betamethasone administration and repeat biophysical profile in the morning.  Upon  arrival to the antenatal unit, she was noted to be contracting regularly at 3-4 minutes with painful contractions.  Recommendation for delivery was made.  The procedure was discussed with the patient and potential risks and  complications were discussed  preoperatively including but not limited to infection, organ damage, bleeding requiring transfusion of blood products with HIV and hepatitis acquisition, DVT, PE, pneumonia, and wound breakdown.  She also requests tubal ligation.  Bilateral tubal  ligation was discussed and the permanence of the procedure, failure rate, and increased ectopic risk were also discussed.  She states she understands all of the above and agrees and consent signed and on the chart.  DESCRIPTION OF PROCEDURE:  The patient was taken to the operating room where she was identified.  Spinal anesthetic was placed per Dr. Malen Gauze and she was placed in the dorsal supine position with a 15 degree left lateral wedge.  She was then prepped  vaginally with Betadine.  Foley catheter was placed in the bladder and she was prepped abdominally with ChloraPrep.  After 3 minute drying time, timeout has been undertaken and she was draped in a sterile fashion.  After testing for adequate spinal  anesthesia, skin was entered through a Pfannenstiel incision and dissection was carried out in layers to the peritoneum.  Peritoneum was taken down superiorly and inferiorly.  Vesicouterine peritoneum was taken down cephalad laterally.  Bladder flap  developed and the bladder blade was placed.  Uterus was incised in a low transverse manner.  The uterine cavity was entered bluntly with a hemostat.  Clear fluid for baby A was noted.  The incision was extended with the fingers.  Baby A was then  delivered from the vertex position without difficulty.  There was a single nuchal cord noted that is loose.  A good cry and tone  was noted upon delivery.  After 1 minute, the cord was clamped and cut and the baby was handed to waiting pediatrics team.   Fluid on baby B was ruptured and clear fluid was again noted.  The baby was in the transverse back up position with the vertex in the right upper quadrant.  The feet were delivered and the baby  was then delivered breech without difficulty.  Good cry and  tone was noted.  After 1 minute, cord was clamped and cut and the baby was handed to waiting pediatrics team.  Placentas were manually delivered and sent to pathology.  Uterine cavity was clean.  Uterus was closed in 2 running locking imbricating layers  of 0 Monocryl suture, which achieved good hemostasis.  Tubes and ovaries were normal bilaterally.  Left fallopian tube was identified from cornu to fimbria and then grasped in its mid ampullary portion with Babcock clamp.  A knuckle of tube was then  doubly ligated with two free ties of plain suture.  The intervening knuckle was sharply resected and unipolar cautery was used to assure hemostasis.  Similar procedure was carried out on the right side.  Lavage was carried out.  Careful inspection  reveals good hemostasis all around.  The anterior peritoneum was then closed in a running fashion with 0 Monocryl, which was also used to reapproximate the pyramidalis muscles in the midline.  Anterior rectus fascia was closed in a running fashion with the 0  looped PDS suture.  Subcutaneous adipose layer was closed with interrupted plain and the skin was closed in a subcuticular fashion with a 4-0 Vicryl on a Keith needle.  Benzoin, Steri-Strips, honeycomb dressing and then pressure dressing are applied.   All counts were correct and the patient was taken to the recovery room in stable condition.   SHW D: 07/10/2020 11:41:36 pm T: 07/11/2020 3:18:00 am  JOB: [id number]/ [id number]

## 2020-07-11 NOTE — Lactation Note (Addendum)
This note was copied from a baby's chart. Lactation Consultation Note  Patient Name: [name] [id number] Date: 07/11/2020 Reason for consult: Follow-up assessment;Late-preterm 34-36.6wks;Maternal endocrine disorder;NICU baby;Multiple gestation (IVF pregnancy) Age:36 hours  LC in to visit with P3 Mom of LPT twins in NICU receiving respiratory support presently.   Mom has pumped 3 times already and is expressing 5-10 ml.  Milk storage bottles provided.  Mom knows to inquire about breast milk labels from babies's nurse.  Mom encouraged to pump both breasts every 2-3 hrs during the day and 3-4 hrs at night to support a full milk supply.   Mom has a Spectra 2 DEBP at home and is aware of Medela Symphony in baby's room for her to use.  Reviewed importance of disassembling all pump parts, washing, and rinsing and separate bin provided for drying parts.  Mom denies any further questions.  Mom aware of lactation support provided for her while babies are in NICU.      Lactation Tools Discussed/Used Tools: Pump Breast pump type: Double-Electric Breast Pump Pump Education: Setup, frequency, and cleaning;Milk Storage Pumping frequency: Q 3hrs Pumped volume: 10 mL  Interventions Interventions: Breast feeding basics reviewed;Skin to skin;Breast massage;Hand express;Education;DEBP;Hand pump  Discharge Pump: Personal (Spectra 2)  Consult Status Consult Status: Follow-up Date: 07/12/20 Follow-up type: In-patient    [name] 07/11/2020, 11:43 AM

## 2020-07-11 NOTE — Lactation Note (Signed)
This note was copied from a baby's chart. Lactation Consultation Note  Patient Name: [name] [id number] Date: 07/11/2020 Reason for consult: Initial assessment;Late-preterm 34-36.6wks;NICU baby;Maternal endocrine disorder;Multiple gestation Age:36 hours P3, LPTI twins in NICU. LC discussed hand expression, mom expressed 11 mls of colostrum that OB -RN will take to NICU tonight. Mom will follow NICU infant feeding policy and procedures. Mom will pump every 3 hours for 15 minutes on initial setting. Mom shown how to use DEBP & how to disassemble, clean, & reassemble parts.  Mom knows to call [facility] services if she has any  BF questions or concerns.  Mom made aware of O/P services, breastfeeding support groups, community resources, and our phone # for post-discharge questions.   Maternal Data Has patient been taught Hand Expression?: Yes Does the patient have breastfeeding experience prior to this delivery?: Yes How long did the patient breastfeed?: Per mom, she BF her 79 year old son for 38 1/ 2 months.  Feeding Mother's Current Feeding Choice: Breast Milk  LATCH Score                    Lactation Tools Discussed/Used Tools: Pump Breast pump type: Double-Electric Breast Pump Pump Education: Setup, frequency, and cleaning;Milk Storage Reason for Pumping: Infant seperation, twins in NICU Pumping frequency: Mom will use DEBP every 3 hours for 15 minutes on inital setting  Interventions Interventions: Breast feeding basics reviewed;Assisted with latch;Skin to skin;Expressed milk;DEBP;Education  Discharge Pump: Personal;DEBP WIC Program: No  Consult Status Consult Status: Follow-up Date: 07/11/20 Follow-up type: In-patient    [name] 07/11/2020, 3:09 AM

## 2020-07-11 NOTE — Anesthesia Postprocedure Evaluation (Signed)
Anesthesia Post Note  Patient: [name]  Procedure(s) Performed: PRIMARY CESAREAN SECTION EDC: 08-11-20 ALLERG: CECLOR     Patient location during evaluation: PACU Anesthesia Type: Spinal Level of consciousness: oriented and awake and alert Pain management: pain level controlled Vital Signs Assessment: post-procedure vital signs reviewed and stable Respiratory status: spontaneous breathing, respiratory function stable and nonlabored ventilation Cardiovascular status: blood pressure returned to baseline and stable Postop Assessment: no headache, no backache, no apparent nausea or vomiting, spinal receding and patient able to bend at knees Anesthetic complications: no   No notable events documented.  Last Vitals:  Vitals:   07/11/20 0225 07/11/20 0229  BP: 116/75   Pulse: 68   Resp: 16   Temp:    SpO2: 99% 99%    Last Pain:  Vitals:   07/11/20 0159  TempSrc: Oral  PainSc:    Pain Goal:                   Marsela Kuan A.

## 2020-07-11 NOTE — Progress Notes (Signed)
Subjective: Postpartum Day 1: Cesarean Delivery Patient reports incisional pain and tolerating PO.   Baby boys doing well in NICU 303/304. CPAP for A and  for B Objective: Vital signs in last 24 hours: Temp:  [98.1 F (36.7 C)-99.1 F (37.3 C)] 98.3 F (36.8 C) (06/16 0737) Pulse Rate:  [68-104] 68 (06/16 0737) Resp:  [12-18] 18 (06/16 0737) BP: (95-131)/(55-84) 116/73 (06/16 0737) SpO2:  [98 %-100 %] 98 % (06/16 0737) Weight:  [81.2 kg-81.5 kg] 81.2 kg (06/16 0133)  Physical Exam:  General: alert, cooperative, appears stated age, and no distress Lochia: appropriate Uterine Fundus: firm Incision: pressure bandage in place DVT Evaluation: No evidence of DVT seen on physical exam.  Recent Labs    07/10/20 2027 07/11/20 0630  HGB 10.4* 8.8*  HCT 31.5* 27.0*    Assessment/Plan: Status post Cesarean section. Doing well postoperatively.  Continue current care .  Turner Daniels 07/11/2020, 9:58 AM

## 2020-07-12 LAB — SURGICAL PATHOLOGY

## 2020-07-12 NOTE — Progress Notes (Signed)
Postpartum Progress Note  Postpartum Day 2 s/p primary Cesarean section and tubal sterilization.  Subjective:  Patient reports no overnight events.  She reports well controlled pain, ambulating without difficulty, voiding spontaneously, tolerating PO.  Vaginal bleeding is light.  Objective: Blood pressure 107/71, pulse 74, temperature 97.9 F (36.6 C), temperature source Oral, resp. rate 18, height 5\' 6"  (1.676 m), weight 81.2 kg, SpO2 99 %, unknown if currently breastfeeding.  Physical Exam:  General: alert and no distress Lochia: appropriate Uterine Fundus: firm Incision: dressing in place DVT Evaluation: No evidence of DVT seen on physical exam.  Recent Labs    07/10/20 2027 07/11/20 0630  HGB 10.4* 8.8*  HCT 31.5* 27.0*    Assessment/Plan: Postpartum Day 2, s/p C-section and BTL Fe/Colace for postoperative anemia Lactation following Doing well, continue routine postpartum care. Anticipate discharge tomorrow. Baby twin boys in NICU care   LOS: 2 days   07/13/20 07/12/2020, 7:14 AM

## 2020-07-12 NOTE — Lactation Note (Signed)
This note was copied from a baby's chart. Lactation Consultation Note  Patient Name: [name] [id number] Date: 07/12/2020 Reason for consult: Follow-up assessment;Multiple gestation Age:36 hours  Maternal Data Has patient been taught Hand Expression?: Yes Does the patient have breastfeeding experience prior to this delivery?: Yes How long did the patient breastfeed?: bf 9 months with first child  Feeding Mother's Current Feeding Choice: Breast Milk and Donor Milk   Lactation Tools Discussed/Used Pumping frequency: q3 Volume wnl today Pending onset of copious milk   Interventions Interventions: Education: IDF  Discharge Discharge Education: Engorgement and breast care Pump: DEBP;Personal (spectra)  Consult Status Consult Status: Follow-up Follow-up type: In-patient   Elder Negus, MA IBCLC 07/12/2020, 9:00 AM

## 2020-07-13 ENCOUNTER — Ambulatory Visit: Payer: Self-pay

## 2020-07-13 MED ORDER — IBUPROFEN 600 MG PO TABS: 600.0000 mg | ORAL_TABLET | Freq: Four times a day (QID) | ORAL | 0 refills | Status: AC | PRN

## 2020-07-13 MED ORDER — ACETAMINOPHEN 325 MG PO TABS: 650.0000 mg | ORAL_TABLET | Freq: Four times a day (QID) | ORAL | 0 refills | Status: AC | PRN

## 2020-07-13 MED ORDER — DOCUSATE SODIUM 100 MG PO CAPS: 100.0000 mg | ORAL_CAPSULE | Freq: Every day | ORAL | Status: DC | PRN

## 2020-07-13 MED ORDER — FERROUS SULFATE 325 (65 FE) MG PO TABS: 325.0000 mg | ORAL_TABLET | ORAL | 2 refills | Status: AC

## 2020-07-13 MED ORDER — OXYCODONE HCL 5 MG PO TABS: 5.0000 mg | ORAL_TABLET | ORAL | 0 refills | Status: AC | PRN

## 2020-07-13 MED ORDER — FERROUS SULFATE 325 (65 FE) MG PO TABS
325.0000 mg | ORAL_TABLET | ORAL | Status: DC
  Administered 2020-07-13: 325 mg via ORAL
  Filled 2020-07-13: qty 1

## 2020-07-13 MED ORDER — DOCUSATE SODIUM 100 MG PO CAPS: 100.0000 mg | ORAL_CAPSULE | Freq: Every day | ORAL | 0 refills | Status: AC | PRN

## 2020-07-13 NOTE — Lactation Note (Signed)
This note was copied from a baby's chart. Lactation Consultation Note  Patient Name: [name] [id number] Date: 07/13/2020   Age:36 hours  Attempted to visit with mom in her room 113 at [facility] but she had already left; she was discharged today.  Spoke to NICU RN Meghan and she reported to [facility] that mom will be coming back to day to visit the twins. Asked NICU RN to call this LC once mom comes back in order to review discharge care.  Maternal Data    Feeding    Lactation Tools Discussed/Used    Interventions    Discharge    Consult Status      Prather Failla Venetia Constable 07/13/2020, 12:52 PM

## 2020-07-13 NOTE — Plan of Care (Signed)
  Problem: Education: Goal: Knowledge of condition will improve Outcome: Adequate for Discharge Goal: Individualized Educational Video(s) Outcome: Adequate for Discharge Goal: Individualized Newborn Educational Video(s) Outcome: Adequate for Discharge   Problem: Activity: Goal: Will verbalize the importance of balancing activity with adequate rest periods Outcome: Adequate for Discharge Goal: Ability to tolerate increased activity will improve Outcome: Adequate for Discharge   Problem: Coping: Goal: Ability to identify and utilize available resources and services will improve Outcome: Adequate for Discharge   Problem: Life Cycle: Goal: Chance of risk for complications during the postpartum period will decrease Outcome: Adequate for Discharge   Problem: Role Relationship: Goal: Ability to demonstrate positive interaction with newborn will improve Outcome: Adequate for Discharge   Problem: Skin Integrity: Goal: Demonstration of wound healing without infection will improve Outcome: Adequate for Discharge   Problem: Education: Goal: Knowledge of General Education information will improve Description: Including pain rating scale, medication(s)/side effects and non-pharmacologic comfort measures Outcome: Adequate for Discharge   Problem: Health Behavior/Discharge Planning: Goal: Ability to manage health-related needs will improve Outcome: Adequate for Discharge   Problem: Clinical Measurements: Goal: Ability to maintain clinical measurements within normal limits will improve Outcome: Adequate for Discharge Goal: Will remain free from infection Outcome: Adequate for Discharge Goal: Diagnostic test results will improve Outcome: Adequate for Discharge Goal: Respiratory complications will improve Outcome: Adequate for Discharge Goal: Cardiovascular complication will be avoided Outcome: Adequate for Discharge   Problem: Activity: Goal: Risk for activity intolerance will  decrease Outcome: Adequate for Discharge   Problem: Nutrition: Goal: Adequate nutrition will be maintained Outcome: Adequate for Discharge   Problem: Elimination: Goal: Will not experience complications related to bowel motility Outcome: Adequate for Discharge Goal: Will not experience complications related to urinary retention Outcome: Adequate for Discharge   Problem: Pain Managment: Goal: General experience of comfort will improve Outcome: Adequate for Discharge   Problem: Safety: Goal: Ability to remain free from injury will improve Outcome: Adequate for Discharge   Problem: Skin Integrity: Goal: Risk for impaired skin integrity will decrease Outcome: Adequate for Discharge

## 2020-07-13 NOTE — Progress Notes (Signed)
Reviewed discharge postpartum instructions with patient regarding medications, when to call MD/go to MAU, signs and symptoms of pre-e, C-section site care, bleeding, breast care/pumping, and to call to schedule 6 week postpartum check with OB. Patient verbalized understanding of discharge postpartum instructions and asked appropriate questions.

## 2020-07-13 NOTE — Lactation Note (Signed)
This note was copied from a baby's chart. Lactation Consultation Note  Patient Name: [name] [id number] Date: 07/13/2020 Reason for consult: Follow-up assessment;Other (Comment);NICU baby;Multiple gestation;Late-preterm 34-36.6wks (IVF pregnancy) Age:36 hours  Visited with mom of 23 hours old LPI twins, mom was discharged today but came back with FOB to visit babies in the NICU. Mom doing STS with baby B when entered the room.  Reviewed discharge education for mom, lactogenesis II, pumping schedule and supply/demand.   Mom voiced pumping is going well, she's pumping consistently, praised her for her efforts. Baby B is the only one going to breast, baby A is still intubated.   Asked mom to call for assistance when needed and to communicate with lactation through baby's RN for a feeding assist.   Feeding plan:  Encouraged mom to continue pumping every 2-3 hours, ideally 8 pumping sessions/24 hours She'll continue taking baby B to breast and call for assistance when needed Mom prefers a weekly visit after her 5 day follow up  FOB present and supportive. Parents reported all questions and concerns were answered, they're both aware of LC services and will call PRN.  Maternal Data    Feeding Mother's Current Feeding Choice: Breast Milk  Lactation Tools Discussed/Used Tools: Pump Breast pump type: Double-Electric Breast Pump Pump Education: Setup, frequency, and cleaning;Milk Storage Reason for Pumping: LPI twins in NICU Pumping frequency: q 3-4 hours Pumped volume: 55 mL  Interventions Interventions: Breast feeding basics reviewed;DEBP;Education  Discharge Discharge Education: Engorgement and breast care;Warning signs for feeding baby  Consult Status Consult Status: Follow-up Date: 07/15/20 Follow-up type: In-patient    [name] 07/13/2020, 8:51 PM

## 2020-07-13 NOTE — Progress Notes (Signed)
Postpartum Progress Note  Postpartum Day 3 s/p primary Cesarean section and tubal sterilization.  Subjective:  Patient reports no overnight events.  She reports well controlled pain, ambulating without difficulty, voiding spontaneously, tolerating PO.  Vaginal bleeding is light. She is passing flatus, no BM yet.   Objective: Blood pressure 113/78, pulse 91, temperature 98.2 F (36.8 C), temperature source Oral, resp. rate 17, height 5\' 6"  (1.676 m), weight 81.2 kg, SpO2 99 %, unknown if currently breastfeeding.  Physical Exam:  General: alert and no distress Lochia: appropriate Uterine Fundus: firm Incision: dressing in place DVT Evaluation: No evidence of DVT seen on physical exam.  Recent Labs    07/10/20 2027 07/11/20 0630  HGB 10.4* 8.8*  HCT 31.5* 27.0*     Assessment/Plan: Postpartum Day 3, s/p C-section and BTL Fe/Colace for postoperative anemia Lactation following Doing well, continue routine postpartum care. Plan for discharge today.  Baby twin boys in NICU care   LOS: 3 days   07/13/20 07/13/2020, 7:43 AM

## 2020-07-14 ENCOUNTER — Ambulatory Visit: Payer: Self-pay

## 2020-07-14 NOTE — Discharge Summary (Signed)
Obstetric Discharge Summary  [name] is a 35 y.o. female that presented with Di-Di IVF pregnancy at 35+3 weeks for BPP 4/8 for baby A. She was admitted for further monitoring on [facility] on 07/10/2020 and on day 1 of her admission, began contractions and preterm labor. She underwent primary Cesarean section with bilateral tubal ligation on 07/10/20, delivering viable twin boys, admitted to NICU.  Her postpartum course was uncomplicated and on PPD#3, she reported well controlled pain, spontaneous voiding, ambulating without difficulty, and tolerating PO.  She was stable for discharge home on 07/13/20 with plans for in-office follow up.  Hemoglobin  Date Value Ref Range Status  07/11/2020 8.8 (L) 12.0 - 15.0 g/dL Final   HCT  Date Value Ref Range Status  07/11/2020 27.0 (L) 36.0 - 46.0 % Final    Physical Exam:  General: alert and no distress Lochia: appropriate Uterine Fundus: firm Incision: healing well DVT Evaluation: No evidence of DVT seen on physical exam.  Discharge Diagnoses:  twin gestation, PTL, C section with BTL  Discharge Information: Date: 07/14/2020 Activity: Pelvic rest, as tolerated Diet: routine Medications: Tylenol, motrin, oxycodone Condition: stable Instructions: Refer to practice specific booklet.  Discussed prior to discharge.  Discharge to: Home  Follow-up Information     [place] Follow up.   Why: Please follow up for 6 week postpartum visit. Contact information: [address] [place] [address] [phone]                 Newborn Data:   [place], Teagarden [[id number]]  Live born female  Birth Weight: 6 lb 4.2 oz (2840 g) APGAR: 8, 9  Newborn Delivery   Birth date/time: 07/10/2020 22:43:00 Delivery type: C-Section, Low Transverse Trial of labor: No C-section categorization: Primary       [place], [place]land [[id number]]  Live born female  Birth Weight: 6 lb 4.2 oz (2840  g) APGAR: 8, 8  Newborn Delivery   Birth date/time: 07/10/2020 22:46:00 Delivery type: C-Section, Low Transverse Trial of labor: No C-section categorization: Primary      Twin boys in NICU care  Lyn Henri 07/14/2020, 8:48 AM

## 2020-07-14 NOTE — Lactation Note (Signed)
This note was copied from a baby's chart. Lactation Consultation Note  Patient Name: [name] UXLKG'M Date: 07/14/2020 Reason for consult: Follow-up assessment;NICU baby;Late-preterm 34-36.6wks;Multiple gestation;Nipple pain/trauma Age:36 days  LC in to visit with Mom and FOB.  Mom's milk volume increased last evening and Mom c/o painful nipples.  Noted swelling in nipples and rubbing along the flange tunnel.  LC changed flange to a larger size of 27 mm for a more comfortable fit. Mom has coconut oil to lubricate flanges prn. Mom instructed to use maintenance setting on pump now that her volume has increased.  Mom instructed to pump for 15-30 mins as long as milk is flowing.   LC provided 2 bins for her pump parts as she was drying parts at side of sink.   Mom states that Baby B has latched to breast for a few minutes but falls asleep.  Encouraged Mom to put baby STS as much as possible.  Baby is exclusively gavage feeding.  Baby A with pneumothorax/chest tube and on ventilator currently and being gavage fed.   Lactation Tools Discussed/Used Tools: Pump;Flanges;Coconut oil Flange Size: 27 Breast pump type: Double-Electric Breast Pump Pumping frequency: Q 2-3 hrs Pumped volume: 150 mL  Interventions Interventions: Skin to skin;Hand express;Breast massage;DEBP;Education  Consult Status Consult Status: Follow-up Date: 07/15/20 Follow-up type: In-patient    [name] 07/14/2020, 11:48 AM

## 2020-07-15 ENCOUNTER — Ambulatory Visit: Payer: Self-pay

## 2020-07-15 NOTE — Lactation Note (Signed)
This note was copied from a baby's chart. Lactation Consultation Note  Patient Name: [name] Date: 07/15/2020 Reason for consult: Follow-up assessment;NICU baby;Late-preterm 34-36.6wks;Infant < 6lbs;Multiple gestation;Nipple pain/trauma Age:36 days  Mom requested LC for some blistering on her left nipple.  Mom reports pumping is more comfortable with the 27 mm flanges.  Mom encouraged to lower the pump strength during pumping.  Comfort Gels provided.  Had Mom hand express milk onto nipple first and then placed the gel pad on breasts.  Mom is still pumping consistently, obtaining approx 150 ml per session. Mom is doing STS with babies when she can.    Twin A "[name]" to have a chest Xray this morning to assess pneumothorax and Twin B "[name]" may have to start phototherapy.  Talked about normal sleepiness with jaundice.  Encouraged STS and frequent pumping.   Lactation to F/U tomorrow to assess nipples.  Lactation Tools Discussed/Used Tools: Pump;Flanges;Comfort gels Flange Size: 27 Breast pump type: Double-Electric Breast Pump Pumping frequency: Q 2-3 hrs Pumped volume: 150 mL  Interventions Interventions: Comfort gels;DEBP;Skin to skin;Breast massage;Hand express;Education  Consult Status Consult Status: Follow-up Date: 07/22/20 Follow-up type: In-patient    [name] 07/15/2020, 1:19 PM

## 2020-07-16 ENCOUNTER — Ambulatory Visit: Payer: Self-pay

## 2020-07-16 NOTE — Lactation Note (Signed)
This note was copied from a baby's chart. Lactation Consultation Note  Patient Name: [name] [name] Date: 07/16/2020   Age:36 days  Visited with mom of 76 days old  LPI twins, she told LC that the blister on her left nipple is now gone. She also told LC that she has switched her flanges for her Spectra pump at home to # 28; and that has made a difference.  Mom was very appreciative of [facility], she voiced the comfort gels worked out and that she'll be also trying power pumping in the AM to make sure she's fully emptying her breasts; praised her for her efforts.   Feeding plan:   Encouraged mom to continue pumping every 2-3 hours, ideally; 8 pumping sessions/24 hours; will continue using flanges # 27 ans 28 Mom will continue using coconut oil prior pumping and her EBM to prevent sore nipples Mom prefers a weekly follow up up visit.   FOB present and supportive. Parents reported all questions and concerns were answered, they're both aware of LC services and will call PRN.   Maternal Data    Feeding    Lactation Tools Discussed/Used    Interventions    Discharge    Consult Status      [name] 07/16/2020, 5:40 PM

## 2020-07-19 ENCOUNTER — Ambulatory Visit: Payer: Self-pay

## 2020-07-19 NOTE — Lactation Note (Signed)
This note was copied from a baby's chart. Lactation Consultation Note Observed 16 minute bf'ing at pre-pumped breast. Last 7-8 minutes were with audible, rhythmic suck-swallows. Reviewed IDF algorithm. Mother's milk supply is wnl for twins.   Patient Name: Kalee Broxton Today's Date: 07/19/2020   Age:36 days  Maternal Data  Pumping frequency: q2-3 Pumped volume: 240 mL  Feeding Mother's Current Feeding Choice: Breast Milk  LATCH Score Latch: Grasps breast easily, tongue down, lips flanged, rhythmical sucking.  Audible Swallowing: A few with stimulation  Type of Nipple: Everted at rest and after stimulation  Comfort (Breast/Nipple): Soft / non-tender  Hold (Positioning): No assistance needed to correctly position infant at breast.  LATCH Score: 9   Consult Status Consult Status: Follow-up Follow-up type: In-patient   Elder Negus, MA IBCLC 07/19/2020, 1:21 PM

## 2020-07-19 NOTE — Lactation Note (Addendum)
This note was copied from a baby's chart. Lactation Consultation Note Baby A did not wake for observed bf'ing. Will plan return visit.   Patient Name: [name] WLSLH'T Date: 07/19/2020   Age:36 days   Elder Negus, MA IBCLC 07/19/2020, 1:25 PM

## 2020-07-22 ENCOUNTER — Ambulatory Visit: Payer: Self-pay

## 2020-07-22 NOTE — Lactation Note (Signed)
This note was copied from a baby's chart. Lactation Consultation Note [name] attempted but did not sustain a latch. His progress is developmentally appropriate. He remained sts with mom during gavage feed.   Patient Name: [name] Today's Date: 07/22/2020 Reason for consult:  (breastfeeding assistance) Age:36 days  Maternal Data  Tools: Nipple Dorris Carnes Nipple shield size: 20 Reason for Pumping: q3 Pumped volume: 270 mL  Feeding Mother's Current Feeding Choice: Breast Milk  LATCH Score Latch: Repeated attempts needed to sustain latch, nipple held in mouth throughout feeding, stimulation needed to elicit sucking reflex.  Audible Swallowing: None  Type of Nipple: Everted at rest and after stimulation  Comfort (Breast/Nipple): Soft / non-tender  Hold (Positioning): Assistance needed to correctly position infant at breast and maintain latch.  LATCH Score: 6  Interventions Interventions: Education;Support pillows;Position options;Assisted with latch;Skin to skin;Adjust position   Consult Status Consult Status: Follow-up Follow-up type: In-patient   [name] 07/22/2020, 3:30 PM

## 2020-07-28 ENCOUNTER — Ambulatory Visit: Payer: Self-pay

## 2020-07-28 ENCOUNTER — Inpatient Hospital Stay (HOSPITAL_COMMUNITY)
Admission: AD | Admit: 2020-07-28 | Discharge: 2020-07-28 | Disposition: A | Discharge status: Home / Self Care | Payer: 59 | Attending: Obstetrics & Gynecology | Admitting: Obstetrics & Gynecology

## 2020-07-28 ENCOUNTER — Other Ambulatory Visit: Payer: Self-pay

## 2020-07-28 DIAGNOSIS — O9123 Nonpurulent mastitis associated with lactation: Secondary | ICD-10-CM | POA: Diagnosis not present

## 2020-07-28 DIAGNOSIS — O99893 Other specified diseases and conditions complicating puerperium: Secondary | ICD-10-CM

## 2020-07-28 DIAGNOSIS — O9102 Infection of nipple associated with the puerperium: Secondary | ICD-10-CM

## 2020-07-28 DIAGNOSIS — N61 Mastitis without abscess: Secondary | ICD-10-CM | POA: Diagnosis not present

## 2020-07-28 DIAGNOSIS — Z881 Allergy status to other antibiotic agents status: Secondary | ICD-10-CM | POA: Insufficient documentation

## 2020-07-28 DIAGNOSIS — O9089 Other complications of the puerperium, not elsewhere classified: Secondary | ICD-10-CM | POA: Diagnosis present

## 2020-07-28 MED ORDER — FLUCONAZOLE 200 MG PO TABS: 200.0000 mg | ORAL_TABLET | Freq: Every day | ORAL | 0 refills | Status: AC

## 2020-07-28 MED ORDER — DICLOXACILLIN SODIUM 250 MG PO CAPS: 500.0000 mg | ORAL_CAPSULE | Freq: Four times a day (QID) | ORAL | 0 refills | Status: AC

## 2020-07-28 NOTE — MAU Note (Signed)
Pt reports to mau with c/o rt breast pain, redness.  Pt states she was diagnosed with thrush a few days ago and was started on a medication.  Pt reports today she noticed right breast was red and sore.  Pt states LC examined her today and suggested she come to [facility] for evaluation.

## 2020-07-28 NOTE — MAU Provider Note (Signed)
History     CSN: [id number]  Arrival date and time: 07/28/20 1033   Event Date/Time   First Provider Initiated Contact with Patient 07/28/20 1356      Chief Complaint  Patient presents with   Thrush   Breast Pain   HPI [name] 35 y.o. Postpartum discharged on 07-13-20 returms after a visit to one twin in NICU with breast pain and redness.  Went to NICU and pumped her breast just before being seen in MAU.  Has had a problem with thrush in the baby and on her nipples.  Since yesterday she has been having pain in her right breast and struggling with plugged ducts.  She is pumping every 3 hours around the clock and today started using ice to her breast before pumping.  OB History     Gravida  2   Para  2   Term  1   Preterm  1   AB      Living  3      SAB      IAB      Ectopic      Multiple  1   Live Births  3           Past Medical History:  Diagnosis Date   Diabetes mellitus without complication (HCC)    Gestational diabetes    Newborn product of in vitro fertilization (IVF) pregnancy     Past Surgical History:  Procedure Laterality Date   CESAREAN SECTION MULTI-GESTATIONAL N/A 07/10/2020   Procedure: PRIMARY CESAREAN SECTION MUTLI-GESTATIONAL EDC: 08-11-20 ALLERG: CECLOR;  Surgeon: Harold Hedge, MD;  Location: [facility] LD [facility];  Service: Obstetrics;  Laterality: N/A;   DILATION AND CURETTAGE OF UTERUS      Family History  Problem Relation Age of Onset   Thyroid disease Mother    Hypertension Father    Diabetes Paternal Grandmother    Breast cancer Paternal Grandmother    Heart disease Paternal Grandfather     Social History   Tobacco Use   Smoking status: Never   Smokeless tobacco: Never  Substance Use Topics   Alcohol use: Not Currently   Drug use: Never    Allergies:  Allergies  Allergen Reactions   Cinnamon Rash   Ceclor [Cefaclor]     Medications Prior to Admission  Medication Sig Dispense Refill Last Dose   acetaminophen  (TYLENOL) 325 MG tablet Take 2 tablets (650 mg total) by mouth every 6 (six) hours as needed. 30 tablet 0    albuterol (PROVENTIL) (2.5 MG/3ML) 0.083% nebulizer solution Take 2.5 mg by nebulization every 4 (four) hours as needed.      albuterol (VENTOLIN HFA) 108 (90 Base) MCG/ACT inhaler Inhale 2 puffs into the lungs every 4 (four) hours as needed.      ascorbic acid (VITAMIN C) 100 MG tablet Take by mouth.      cholecalciferol (VITAMIN D3) 25 MCG (1000 UNIT) tablet Take 1,000 Units by mouth daily.      docusate sodium (COLACE) 100 MG capsule Take 1 capsule (100 mg total) by mouth daily as needed for mild constipation. 10 capsule 0    ferrous sulfate 325 (65 FE) MG tablet Take 1 tablet (325 mg total) by mouth every other day. 30 tablet 2    ibuprofen (ADVIL) 600 MG tablet Take 1 tablet (600 mg total) by mouth every 6 (six) hours as needed for moderate pain. 30 tablet 0    oxyCODONE (OXY IR/ROXICODONE) 5 MG immediate release  tablet Take 1 tablet (5 mg total) by mouth every 4 (four) hours as needed for moderate pain. 15 tablet 0    Prenatal Vit-Fe Fumarate-FA (PRENATAL MULTIVITAMIN) TABS tablet Take 1 tablet by mouth daily at 12 noon.       Review of Systems  Constitutional:  Negative for fever.  Respiratory:  Negative for cough, shortness of breath and wheezing.   Genitourinary:        Breast pain - right  Physical Exam   Blood pressure 114/72, pulse 86, temperature 97.9 F (36.6 C), temperature source Oral, resp. rate 18, weight 65 kg, SpO2 100 %, unknown if currently breastfeeding.  Physical Exam Vitals and nursing note reviewed.  Constitutional:      Appearance: She is well-developed.  HENT:     Head: Normocephalic.  Cardiovascular:     Rate and Rhythm: Regular rhythm.  Pulmonary:     Effort: Pulmonary effort is normal.  Abdominal:     Palpations: Abdomen is soft.     Tenderness: There is no abdominal tenderness. There is no guarding or rebound.  Genitourinary:    Comments:  Right breast redness noted - light red in a streak at 12 o'clockwith worsening at the areola. Very tender to touch. Musculoskeletal:        General: Normal range of motion.     Cervical back: Neck supple.  Skin:    General: Skin is warm and dry.  Neurological:     Mental Status: She is alert and oriented to person, place, and time.  Psychiatric:        Mood and Affect: Mood normal.        Behavior: Behavior normal.        Thought Content: Thought content normal.    MAU Course  Procedures  MDM Will treat for mastitis as her breast is not resolved after pumping today.  No fever yet, but with visiting NICU and caring for one baby at home, she is at risk of her condition worsening.  She is currently using nystatin on her nipples for thrush.  Will begin oral treatment for thrush and begin antibiotic for infection causing redness and mastitis. Reviewed antibiotics with her due to her allergy at age 19-5 with swollen joints due to ceclor.  Reports she has taken penicillin with no problems and will give dicloxacillin - reviewed stopping medication if any signs of allergic reaction.  Advised medication is prescribed for 10 days but OK to stop medication at 5-7 days if breast pain and redness has resolved.  Assessment and Plan  Mastitis Nipple yeast  Plan Prescribed oral diflucan for yeast and dicloxicillin for mastitis Continue to pump as you have been doing. Call your doctor if your symptoms are worsening. Continue to work with Architect in NICU.  Merl Guardino L Joanne Salah 07/28/2020, 2:22 PM

## 2020-07-28 NOTE — Discharge Instructions (Signed)
If antibiotics are helping and area has resolved, you can stop the antibiotics in 7 days. Stop the antibiotics if you develop an allergic reaction.

## 2020-07-28 NOTE — Lactation Note (Signed)
This note was copied from a baby's chart. Lactation Consultation Note Mother is 2 weeks postpartum with preterm twins. She is pumping frequently and has an abundant milk supply. Two days ago, mother began feeling pain consistent with plugged duct in upper quadrant of R breast. Today, she has localized erythema and increased pain to the touch. She denies fever or flu-like symptoms at this time.  POC:  Mother to use ice pack on breast between pumpings and heat pack while pumping Mom to increase pumpings to q2 hours today until breast feels soft and plugged duct has resolved Mother to massage area of plugged duct and lean forward while pumping (dangle pumping) Mother to visit MAU this morning for MD assessment  Patient Name: [name] Date: 07/28/2020 Reason for consult: Mother's request (mother with s/s of mastitis) Age:81 wk.o.  Maternal Data  Mother is day 2 of R upper quadrant breast fullness and pain. Day 1 of erythema.  Currently using nystatin for yeast infection of breasts  Feeding Mother's Current Feeding Choice: Breast Milk Nipple Type: Dr. Levert Feinstein Preemie  Interventions Interventions: Ice (heat packs)   Consult Status Consult Status: Follow-up Follow-up type: Other (comment) (referred to MAU)   Elder Negus, MA IBCLC 07/28/2020, 9:53 AM

## 2020-07-31 ENCOUNTER — Inpatient Hospital Stay (HOSPITAL_COMMUNITY): Admit: 2020-07-31 | Payer: 59 | Admitting: Obstetrics and Gynecology

## 2020-08-30 ENCOUNTER — Encounter (HOSPITAL_COMMUNITY): Payer: Self-pay | Admitting: Obstetrics and Gynecology

## 2021-02-17 ENCOUNTER — Other Ambulatory Visit: Payer: Self-pay | Admitting: Obstetrics and Gynecology

## 2021-02-17 DIAGNOSIS — Z9189 Other specified personal risk factors, not elsewhere classified: Secondary | ICD-10-CM

## 2021-03-01 ENCOUNTER — Other Ambulatory Visit: Payer: 59

## 2021-09-19 ENCOUNTER — Ambulatory Visit (INDEPENDENT_AMBULATORY_CARE_PROVIDER_SITE_OTHER): Payer: 59 | Admitting: Behavioral Health

## 2021-09-19 DIAGNOSIS — F4323 Adjustment disorder with mixed anxiety and depressed mood: Secondary | ICD-10-CM

## 2021-09-19 NOTE — Progress Notes (Signed)
                [name] L Rishabh Rinkenberger, LMFT 
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
## 2021-12-02 ENCOUNTER — Ambulatory Visit: Payer: 59 | Admitting: Behavioral Health

## 2021-12-11 ENCOUNTER — Ambulatory Visit: Payer: 59 | Admitting: Behavioral Health

## 2022-04-01 ENCOUNTER — Other Ambulatory Visit: Payer: Self-pay | Admitting: Obstetrics and Gynecology

## 2022-04-01 DIAGNOSIS — Z9189 Other specified personal risk factors, not elsewhere classified: Secondary | ICD-10-CM

## 2022-10-28 ENCOUNTER — Other Ambulatory Visit (HOSPITAL_COMMUNITY): Payer: Self-pay

## 2022-10-28 MED ORDER — AMPHETAMINE-DEXTROAMPHET ER 20 MG PO CP24: 20.0000 mg | ORAL_CAPSULE | Freq: Every morning | ORAL | 0 refills | Status: AC

## 2022-10-28 MED ORDER — AMPHETAMINE-DEXTROAMPHET ER 20 MG PO CP24
20.0000 mg | ORAL_CAPSULE | Freq: Every morning | ORAL | 0 refills | Status: AC
  Filled 2022-10-28: qty 30, 30d supply, fill #0

## 2022-10-29 ENCOUNTER — Other Ambulatory Visit: Payer: Self-pay

## 2023-06-04 ENCOUNTER — Other Ambulatory Visit (HOSPITAL_COMMUNITY): Payer: Self-pay

## 2023-06-04 MED ORDER — AMPHETAMINE-DEXTROAMPHET ER 20 MG PO CP24
20.0000 mg | ORAL_CAPSULE | Freq: Every morning | ORAL | 0 refills | Status: DC
  Filled 2023-06-04 – 2023-06-05 (×2): qty 30, 30d supply, fill #0

## 2023-06-05 ENCOUNTER — Other Ambulatory Visit (HOSPITAL_COMMUNITY): Payer: Self-pay

## 2023-06-05 ENCOUNTER — Other Ambulatory Visit (HOSPITAL_BASED_OUTPATIENT_CLINIC_OR_DEPARTMENT_OTHER): Payer: Self-pay

## 2023-06-07 ENCOUNTER — Other Ambulatory Visit (HOSPITAL_COMMUNITY): Payer: Self-pay

## 2023-06-28 ENCOUNTER — Other Ambulatory Visit (HOSPITAL_BASED_OUTPATIENT_CLINIC_OR_DEPARTMENT_OTHER): Payer: Self-pay

## 2023-06-28 MED ORDER — FLUOXETINE HCL 40 MG PO CAPS
40.0000 mg | ORAL_CAPSULE | Freq: Every morning | ORAL | 1 refills | Status: AC
  Filled 2023-06-28: qty 90, 90d supply, fill #0
  Filled 2023-08-27: qty 30, 30d supply, fill #0
  Filled 2023-11-01: qty 30, 30d supply, fill #1
  Filled 2023-12-14: qty 30, 30d supply, fill #2
  Filled 2024-01-29: qty 30, 30d supply, fill #3
  Filled 2024-02-25: qty 30, 30d supply, fill #4

## 2023-06-28 MED ORDER — AMPHETAMINE-DEXTROAMPHET ER 20 MG PO CP24
20.0000 mg | ORAL_CAPSULE | Freq: Every morning | ORAL | 0 refills | Status: AC
  Filled 2023-07-14: qty 30, 30d supply, fill #0

## 2023-06-28 MED ORDER — AMPHETAMINE-DEXTROAMPHET ER 20 MG PO CP24
20.0000 mg | ORAL_CAPSULE | Freq: Every morning | ORAL | 0 refills | Status: AC
  Filled 2023-10-13: qty 30, 30d supply, fill #0

## 2023-06-28 MED ORDER — FLUOXETINE HCL 20 MG PO CAPS
20.0000 mg | ORAL_CAPSULE | Freq: Every morning | ORAL | 1 refills | Status: AC
  Filled 2023-06-28 – 2023-07-14 (×2): qty 30, 30d supply, fill #0
  Filled 2023-08-27: qty 30, 30d supply, fill #1
  Filled 2023-10-09: qty 30, 30d supply, fill #2
  Filled 2023-11-15: qty 30, 30d supply, fill #3
  Filled 2024-01-01: qty 30, 30d supply, fill #4
  Filled 2024-01-29: qty 30, 30d supply, fill #5

## 2023-06-28 MED ORDER — HYDROXYZINE HCL 10 MG PO TABS
10.0000 mg | ORAL_TABLET | Freq: Every evening | ORAL | 3 refills | Status: DC | PRN
  Filled 2023-06-28 – 2023-07-13 (×2): qty 30, 30d supply, fill #0
  Filled 2023-08-27: qty 30, 30d supply, fill #1
  Filled 2023-10-09: qty 30, 30d supply, fill #2
  Filled 2023-11-15: qty 30, 30d supply, fill #3

## 2023-06-28 MED ORDER — AMPHETAMINE-DEXTROAMPHET ER 20 MG PO CP24
20.0000 mg | ORAL_CAPSULE | Freq: Every morning | ORAL | 0 refills | Status: AC
  Filled 2023-08-30: qty 30, 30d supply, fill #0

## 2023-07-08 ENCOUNTER — Other Ambulatory Visit (HOSPITAL_BASED_OUTPATIENT_CLINIC_OR_DEPARTMENT_OTHER): Payer: Self-pay

## 2023-07-13 ENCOUNTER — Other Ambulatory Visit (HOSPITAL_BASED_OUTPATIENT_CLINIC_OR_DEPARTMENT_OTHER): Payer: Self-pay

## 2023-07-14 ENCOUNTER — Other Ambulatory Visit (HOSPITAL_BASED_OUTPATIENT_CLINIC_OR_DEPARTMENT_OTHER): Payer: Self-pay

## 2023-07-14 ENCOUNTER — Other Ambulatory Visit: Payer: Self-pay

## 2023-08-27 ENCOUNTER — Other Ambulatory Visit (HOSPITAL_BASED_OUTPATIENT_CLINIC_OR_DEPARTMENT_OTHER): Payer: Self-pay

## 2023-08-27 ENCOUNTER — Other Ambulatory Visit: Payer: Self-pay

## 2023-08-30 ENCOUNTER — Other Ambulatory Visit (HOSPITAL_BASED_OUTPATIENT_CLINIC_OR_DEPARTMENT_OTHER): Payer: Self-pay

## 2023-09-03 ENCOUNTER — Other Ambulatory Visit (HOSPITAL_BASED_OUTPATIENT_CLINIC_OR_DEPARTMENT_OTHER): Payer: Self-pay

## 2023-09-21 ENCOUNTER — Other Ambulatory Visit (HOSPITAL_BASED_OUTPATIENT_CLINIC_OR_DEPARTMENT_OTHER): Payer: Self-pay

## 2023-09-21 MED ORDER — AMPHETAMINE-DEXTROAMPHET ER 20 MG PO CP24: 20.0000 mg | ORAL_CAPSULE | Freq: Every morning | ORAL | 0 refills | Status: AC

## 2023-09-21 MED ORDER — AMPHETAMINE-DEXTROAMPHET ER 20 MG PO CP24
20.0000 mg | ORAL_CAPSULE | Freq: Every morning | ORAL | 0 refills | Status: AC
  Filled 2023-11-15: qty 30, 30d supply, fill #0

## 2023-10-13 ENCOUNTER — Other Ambulatory Visit (HOSPITAL_BASED_OUTPATIENT_CLINIC_OR_DEPARTMENT_OTHER): Payer: Self-pay

## 2023-11-02 ENCOUNTER — Other Ambulatory Visit (HOSPITAL_BASED_OUTPATIENT_CLINIC_OR_DEPARTMENT_OTHER): Payer: Self-pay

## 2023-11-15 ENCOUNTER — Other Ambulatory Visit (HOSPITAL_BASED_OUTPATIENT_CLINIC_OR_DEPARTMENT_OTHER): Payer: Self-pay

## 2023-11-18 ENCOUNTER — Other Ambulatory Visit (HOSPITAL_BASED_OUTPATIENT_CLINIC_OR_DEPARTMENT_OTHER): Payer: Self-pay

## 2023-12-08 ENCOUNTER — Other Ambulatory Visit (HOSPITAL_BASED_OUTPATIENT_CLINIC_OR_DEPARTMENT_OTHER): Payer: Self-pay

## 2023-12-08 MED ORDER — AMPHETAMINE-DEXTROAMPHET ER 20 MG PO CP24
20.0000 mg | ORAL_CAPSULE | Freq: Every morning | ORAL | 0 refills | Status: AC
  Filled 2024-02-14: qty 30, 30d supply, fill #0

## 2023-12-08 MED ORDER — HYDROXYZINE HCL 10 MG PO TABS
10.0000 mg | ORAL_TABLET | Freq: Every evening | ORAL | 1 refills | Status: AC | PRN
  Filled 2023-12-08: qty 30, 30d supply, fill #0
  Filled 2024-01-22: qty 30, 30d supply, fill #1
  Filled 2024-02-25: qty 30, 30d supply, fill #2

## 2023-12-08 MED ORDER — AMPHETAMINE-DEXTROAMPHET ER 20 MG PO CP24
20.0000 mg | ORAL_CAPSULE | Freq: Every morning | ORAL | 0 refills | Status: AC
  Filled 2023-12-08 – 2024-01-01 (×3): qty 30, 30d supply, fill #0

## 2023-12-14 ENCOUNTER — Other Ambulatory Visit (HOSPITAL_BASED_OUTPATIENT_CLINIC_OR_DEPARTMENT_OTHER): Payer: Self-pay

## 2023-12-16 ENCOUNTER — Other Ambulatory Visit (HOSPITAL_BASED_OUTPATIENT_CLINIC_OR_DEPARTMENT_OTHER): Payer: Self-pay

## 2023-12-17 ENCOUNTER — Other Ambulatory Visit: Payer: Self-pay

## 2023-12-24 ENCOUNTER — Other Ambulatory Visit (HOSPITAL_BASED_OUTPATIENT_CLINIC_OR_DEPARTMENT_OTHER): Payer: Self-pay

## 2023-12-29 ENCOUNTER — Other Ambulatory Visit (HOSPITAL_BASED_OUTPATIENT_CLINIC_OR_DEPARTMENT_OTHER): Payer: Self-pay

## 2024-01-01 ENCOUNTER — Encounter: Payer: Self-pay | Admitting: Pharmacist

## 2024-01-01 ENCOUNTER — Other Ambulatory Visit (HOSPITAL_BASED_OUTPATIENT_CLINIC_OR_DEPARTMENT_OTHER): Payer: Self-pay

## 2024-01-03 ENCOUNTER — Other Ambulatory Visit (HOSPITAL_BASED_OUTPATIENT_CLINIC_OR_DEPARTMENT_OTHER): Payer: Self-pay

## 2024-01-03 ENCOUNTER — Other Ambulatory Visit: Payer: Self-pay

## 2024-01-05 ENCOUNTER — Other Ambulatory Visit (HOSPITAL_BASED_OUTPATIENT_CLINIC_OR_DEPARTMENT_OTHER): Payer: Self-pay

## 2024-02-02 ENCOUNTER — Other Ambulatory Visit (HOSPITAL_BASED_OUTPATIENT_CLINIC_OR_DEPARTMENT_OTHER): Payer: Self-pay

## 2024-02-02 MED ORDER — AMPHETAMINE-DEXTROAMPHET ER 20 MG PO CP24: 20.0000 mg | ORAL_CAPSULE | Freq: Every morning | ORAL | 0 refills | Status: AC

## 2024-02-02 MED ORDER — FLUOXETINE HCL 20 MG PO CAPS: 20.0000 mg | ORAL_CAPSULE | Freq: Every morning | ORAL | 1 refills | Status: AC

## 2024-02-02 MED ORDER — FLUOXETINE HCL 40 MG PO CAPS: 40.0000 mg | ORAL_CAPSULE | Freq: Every morning | ORAL | 1 refills | Status: AC

## 2024-02-14 ENCOUNTER — Other Ambulatory Visit: Payer: Self-pay

## 2024-02-14 ENCOUNTER — Other Ambulatory Visit (HOSPITAL_BASED_OUTPATIENT_CLINIC_OR_DEPARTMENT_OTHER): Payer: Self-pay

## 2024-02-29 ENCOUNTER — Other Ambulatory Visit (HOSPITAL_BASED_OUTPATIENT_CLINIC_OR_DEPARTMENT_OTHER): Payer: Self-pay
